# Patient Record
Sex: Male | Born: 1960 | Race: White | Hispanic: No | Marital: Married | Smoking: Never smoker
Health system: Southern US, Community
[De-identification: ages and names within clinical notes are randomized; demographics above are authoritative.]

## PROBLEM LIST (undated history)

## (undated) DIAGNOSIS — I639 Cerebral infarction, unspecified: Secondary | ICD-10-CM

## (undated) DIAGNOSIS — E119 Type 2 diabetes mellitus without complications: Secondary | ICD-10-CM

## (undated) DIAGNOSIS — E785 Hyperlipidemia, unspecified: Secondary | ICD-10-CM

## (undated) DIAGNOSIS — I10 Essential (primary) hypertension: Secondary | ICD-10-CM

## (undated) DIAGNOSIS — R748 Abnormal levels of other serum enzymes: Secondary | ICD-10-CM

## (undated) DIAGNOSIS — K7290 Hepatic failure, unspecified without coma: Secondary | ICD-10-CM

## (undated) DIAGNOSIS — E669 Obesity, unspecified: Secondary | ICD-10-CM

## (undated) DIAGNOSIS — L0390 Cellulitis, unspecified: Secondary | ICD-10-CM

## (undated) HISTORY — PX: HEMORROIDECTOMY: SUR656

## (undated) HISTORY — DX: Essential (primary) hypertension: I10

## (undated) HISTORY — DX: Cerebral infarction, unspecified: I63.9

## (undated) HISTORY — DX: Hyperlipidemia, unspecified: E78.5

---

## 2000-06-12 ENCOUNTER — Emergency Department (HOSPITAL_COMMUNITY): Admission: EM | Admit: 2000-06-12 | Discharge: 2000-06-12 | Payer: Self-pay | Admitting: *Deleted

## 2000-09-19 ENCOUNTER — Encounter: Payer: Self-pay | Admitting: Family Medicine

## 2000-09-19 ENCOUNTER — Ambulatory Visit (HOSPITAL_COMMUNITY): Admission: RE | Admit: 2000-09-19 | Discharge: 2000-09-19 | Payer: Self-pay | Admitting: Family Medicine

## 2006-07-11 ENCOUNTER — Ambulatory Visit (HOSPITAL_COMMUNITY): Admission: RE | Admit: 2006-07-11 | Discharge: 2006-07-11 | Payer: Self-pay | Admitting: Family Medicine

## 2006-12-10 ENCOUNTER — Ambulatory Visit (HOSPITAL_COMMUNITY): Admission: RE | Admit: 2006-12-10 | Discharge: 2006-12-10 | Payer: Self-pay | Admitting: Orthopaedic Surgery

## 2007-05-27 ENCOUNTER — Ambulatory Visit (HOSPITAL_COMMUNITY): Admission: RE | Admit: 2007-05-27 | Discharge: 2007-05-27 | Payer: Self-pay | Admitting: Internal Medicine

## 2008-03-15 ENCOUNTER — Ambulatory Visit (HOSPITAL_COMMUNITY): Admission: RE | Admit: 2008-03-15 | Discharge: 2008-03-15 | Payer: Self-pay | Admitting: Family Medicine

## 2008-03-29 ENCOUNTER — Ambulatory Visit (HOSPITAL_COMMUNITY): Admission: RE | Admit: 2008-03-29 | Discharge: 2008-03-29 | Payer: Self-pay | Admitting: Family Medicine

## 2009-10-24 ENCOUNTER — Encounter (HOSPITAL_COMMUNITY)
Admission: RE | Admit: 2009-10-24 | Discharge: 2009-11-23 | Payer: Self-pay | Source: Home / Self Care | Admitting: Orthopaedic Surgery

## 2009-11-14 ENCOUNTER — Ambulatory Visit (HOSPITAL_COMMUNITY)
Admission: RE | Admit: 2009-11-14 | Discharge: 2009-11-14 | Payer: Self-pay | Source: Home / Self Care | Admitting: Orthopaedic Surgery

## 2009-11-30 ENCOUNTER — Encounter (HOSPITAL_COMMUNITY)
Admission: RE | Admit: 2009-11-30 | Discharge: 2009-12-30 | Payer: Self-pay | Source: Home / Self Care | Attending: Orthopaedic Surgery | Admitting: Orthopaedic Surgery

## 2011-03-25 ENCOUNTER — Other Ambulatory Visit (HOSPITAL_COMMUNITY): Payer: Self-pay | Admitting: Family Medicine

## 2011-03-27 ENCOUNTER — Ambulatory Visit (HOSPITAL_COMMUNITY)
Admission: RE | Admit: 2011-03-27 | Discharge: 2011-03-27 | Disposition: A | Discharge status: Home / Self Care | Payer: BC Managed Care – PPO | Source: Ambulatory Visit | Attending: Family Medicine | Admitting: Family Medicine

## 2011-03-27 DIAGNOSIS — R7401 Elevation of levels of liver transaminase levels: Secondary | ICD-10-CM | POA: Insufficient documentation

## 2011-03-27 DIAGNOSIS — R7402 Elevation of levels of lactic acid dehydrogenase (LDH): Secondary | ICD-10-CM | POA: Insufficient documentation

## 2011-03-27 DIAGNOSIS — K7689 Other specified diseases of liver: Secondary | ICD-10-CM | POA: Insufficient documentation

## 2011-03-27 DIAGNOSIS — R161 Splenomegaly, not elsewhere classified: Secondary | ICD-10-CM | POA: Insufficient documentation

## 2013-01-04 ENCOUNTER — Other Ambulatory Visit (HOSPITAL_COMMUNITY): Payer: Self-pay | Admitting: Family Medicine

## 2013-01-04 DIAGNOSIS — R9389 Abnormal findings on diagnostic imaging of other specified body structures: Secondary | ICD-10-CM

## 2013-01-11 ENCOUNTER — Ambulatory Visit (HOSPITAL_COMMUNITY)
Admission: RE | Admit: 2013-01-11 | Discharge: 2013-01-11 | Disposition: A | Discharge status: Home / Self Care | Payer: BC Managed Care – PPO | Source: Ambulatory Visit | Attending: Family Medicine | Admitting: Family Medicine

## 2013-01-11 ENCOUNTER — Encounter (HOSPITAL_COMMUNITY): Payer: Self-pay

## 2013-01-11 DIAGNOSIS — I8500 Esophageal varices without bleeding: Secondary | ICD-10-CM | POA: Insufficient documentation

## 2013-01-11 DIAGNOSIS — K7689 Other specified diseases of liver: Secondary | ICD-10-CM | POA: Insufficient documentation

## 2013-01-11 DIAGNOSIS — I2584 Coronary atherosclerosis due to calcified coronary lesion: Secondary | ICD-10-CM | POA: Insufficient documentation

## 2013-01-11 DIAGNOSIS — R911 Solitary pulmonary nodule: Secondary | ICD-10-CM | POA: Insufficient documentation

## 2013-01-11 DIAGNOSIS — R9389 Abnormal findings on diagnostic imaging of other specified body structures: Secondary | ICD-10-CM

## 2013-01-11 DIAGNOSIS — K7460 Unspecified cirrhosis of liver: Secondary | ICD-10-CM | POA: Insufficient documentation

## 2013-01-11 MED ORDER — IOHEXOL 300 MG/ML  SOLN
80.0000 mL | Freq: Once | INTRAMUSCULAR | Status: AC | PRN
  Administered 2013-01-11: 80 mL via INTRAVENOUS

## 2013-05-10 ENCOUNTER — Emergency Department (HOSPITAL_COMMUNITY): Payer: BC Managed Care – PPO

## 2013-05-10 ENCOUNTER — Emergency Department (HOSPITAL_COMMUNITY): Payer: Self-pay

## 2013-05-10 ENCOUNTER — Inpatient Hospital Stay (HOSPITAL_COMMUNITY): Payer: Self-pay

## 2013-05-10 ENCOUNTER — Inpatient Hospital Stay (HOSPITAL_COMMUNITY)
Admission: EM | Admit: 2013-05-10 | Discharge: 2013-05-11 | DRG: 392 | Disposition: A | Discharge status: Home / Self Care | Payer: BC Managed Care – PPO | Attending: Internal Medicine | Admitting: Internal Medicine

## 2013-05-10 ENCOUNTER — Encounter (HOSPITAL_COMMUNITY): Payer: Self-pay | Admitting: Emergency Medicine

## 2013-05-10 DIAGNOSIS — E669 Obesity, unspecified: Secondary | ICD-10-CM

## 2013-05-10 DIAGNOSIS — D6959 Other secondary thrombocytopenia: Secondary | ICD-10-CM | POA: Diagnosis present

## 2013-05-10 DIAGNOSIS — R1011 Right upper quadrant pain: Principal | ICD-10-CM | POA: Diagnosis present

## 2013-05-10 DIAGNOSIS — E1165 Type 2 diabetes mellitus with hyperglycemia: Secondary | ICD-10-CM

## 2013-05-10 DIAGNOSIS — Z6832 Body mass index (BMI) 32.0-32.9, adult: Secondary | ICD-10-CM

## 2013-05-10 DIAGNOSIS — F1010 Alcohol abuse, uncomplicated: Secondary | ICD-10-CM | POA: Diagnosis present

## 2013-05-10 DIAGNOSIS — E119 Type 2 diabetes mellitus without complications: Secondary | ICD-10-CM | POA: Diagnosis present

## 2013-05-10 DIAGNOSIS — IMO0001 Reserved for inherently not codable concepts without codable children: Secondary | ICD-10-CM | POA: Diagnosis present

## 2013-05-10 DIAGNOSIS — R079 Chest pain, unspecified: Secondary | ICD-10-CM | POA: Diagnosis present

## 2013-05-10 HISTORY — DX: Abnormal levels of other serum enzymes: R74.8

## 2013-05-10 HISTORY — DX: Type 2 diabetes mellitus without complications: E11.9

## 2013-05-10 HISTORY — DX: Obesity, unspecified: E66.9

## 2013-05-10 LAB — CBC WITH DIFFERENTIAL/PLATELET
Basophils Absolute: 0 10*3/uL (ref 0.0–0.1)
Basophils Relative: 1 % (ref 0–1)
EOS PCT: 3 % (ref 0–5)
Eosinophils Absolute: 0.2 10*3/uL (ref 0.0–0.7)
HEMATOCRIT: 41.7 % (ref 39.0–52.0)
Hemoglobin: 14.9 g/dL (ref 13.0–17.0)
LYMPHS ABS: 1 10*3/uL (ref 0.7–4.0)
LYMPHS PCT: 24 % (ref 12–46)
MCH: 34.1 pg — ABNORMAL HIGH (ref 26.0–34.0)
MCHC: 35.7 g/dL (ref 30.0–36.0)
MCV: 95.4 fL (ref 78.0–100.0)
Monocytes Absolute: 0.5 10*3/uL (ref 0.1–1.0)
Monocytes Relative: 12 % (ref 3–12)
NEUTROS ABS: 2.6 10*3/uL (ref 1.7–7.7)
Neutrophils Relative %: 60 % (ref 43–77)
PLATELETS: 90 10*3/uL — AB (ref 150–400)
RBC: 4.37 MIL/uL (ref 4.22–5.81)
RDW: 12.5 % (ref 11.5–15.5)
WBC: 4.4 10*3/uL (ref 4.0–10.5)

## 2013-05-10 LAB — COMPREHENSIVE METABOLIC PANEL
ALK PHOS: 223 U/L — AB (ref 39–117)
ALT: 34 U/L (ref 0–53)
AST: 40 U/L — AB (ref 0–37)
Albumin: 3.2 g/dL — ABNORMAL LOW (ref 3.5–5.2)
BILIRUBIN TOTAL: 2.1 mg/dL — AB (ref 0.3–1.2)
BUN: 9 mg/dL (ref 6–23)
CHLORIDE: 100 meq/L (ref 96–112)
CO2: 23 meq/L (ref 19–32)
CREATININE: 0.58 mg/dL (ref 0.50–1.35)
Calcium: 9.3 mg/dL (ref 8.4–10.5)
GFR calc Af Amer: >90 mL/min (ref >90)
Glucose, Bld: 313 mg/dL — ABNORMAL HIGH (ref 70–99)
POTASSIUM: 4 meq/L (ref 3.7–5.3)
Sodium: 137 mEq/L (ref 137–147)
Total Protein: 7.8 g/dL (ref 6.0–8.3)

## 2013-05-10 LAB — TROPONIN I
Troponin I: <0.3 ng/mL (ref <0.30)
Troponin I: <0.3 ng/mL (ref <0.30)

## 2013-05-10 LAB — LIPASE, BLOOD: Lipase: 34 U/L (ref 11–59)

## 2013-05-10 LAB — GLUCOSE, CAPILLARY: GLUCOSE-CAPILLARY: 197 mg/dL — AB (ref 70–99)

## 2013-05-10 LAB — PRO B NATRIURETIC PEPTIDE: Pro B Natriuretic peptide (BNP): 15.5 pg/mL (ref 0–125)

## 2013-05-10 MED ORDER — SODIUM CHLORIDE 0.9 % IV SOLN
INTRAVENOUS | Status: DC
  Administered 2013-05-10: 18:00:00 via INTRAVENOUS

## 2013-05-10 MED ORDER — IOHEXOL 350 MG/ML SOLN
100.0000 mL | Freq: Once | INTRAVENOUS | Status: AC | PRN
  Administered 2013-05-10: 100 mL via INTRAVENOUS

## 2013-05-10 MED ORDER — GLYBURIDE-METFORMIN 2.5-500 MG PO TABS: 2.0000 | ORAL_TABLET | Freq: Two times a day (BID) | ORAL | Status: DC

## 2013-05-10 MED ORDER — SODIUM CHLORIDE 0.9 % IV BOLUS (SEPSIS)
250.0000 mL | Freq: Once | INTRAVENOUS | Status: AC
  Administered 2013-05-10: 250 mL via INTRAVENOUS

## 2013-05-10 MED ORDER — INSULIN ASPART 100 UNIT/ML ~~LOC~~ SOLN
0.0000 [IU] | Freq: Three times a day (TID) | SUBCUTANEOUS | Status: DC
  Administered 2013-05-10 – 2013-05-11 (×2): 4 [IU] via SUBCUTANEOUS
  Administered 2013-05-11: 7 [IU] via SUBCUTANEOUS

## 2013-05-10 MED ORDER — HEPARIN SODIUM (PORCINE) 5000 UNIT/ML IJ SOLN
5000.0000 [IU] | Freq: Three times a day (TID) | INTRAMUSCULAR | Status: DC
  Administered 2013-05-10 (×2): 5000 [IU] via SUBCUTANEOUS
  Filled 2013-05-10 (×4): qty 1

## 2013-05-10 MED ORDER — NITROGLYCERIN 0.4 MG SL SUBL: 0.4000 mg | SUBLINGUAL_TABLET | SUBLINGUAL | Status: DC | PRN

## 2013-05-10 MED ORDER — ONDANSETRON HCL 4 MG/2ML IJ SOLN: 4.0000 mg | Freq: Four times a day (QID) | INTRAMUSCULAR | Status: DC | PRN

## 2013-05-10 MED ORDER — METFORMIN HCL 500 MG PO TABS
500.0000 mg | ORAL_TABLET | Freq: Two times a day (BID) | ORAL | Status: DC
  Administered 2013-05-10 – 2013-05-11 (×2): 500 mg via ORAL
  Filled 2013-05-10 (×2): qty 1

## 2013-05-10 MED ORDER — ONDANSETRON HCL 4 MG PO TABS: 4.0000 mg | ORAL_TABLET | Freq: Four times a day (QID) | ORAL | Status: DC | PRN

## 2013-05-10 MED ORDER — PANTOPRAZOLE SODIUM 40 MG PO TBEC
40.0000 mg | DELAYED_RELEASE_TABLET | Freq: Every day | ORAL | Status: DC
Start: 2013-05-10 — End: 2013-05-10
  Administered 2013-05-10: 40 mg via ORAL

## 2013-05-10 MED ORDER — ASPIRIN 81 MG PO CHEW
324.0000 mg | CHEWABLE_TABLET | Freq: Once | ORAL | Status: AC
  Administered 2013-05-10: 324 mg via ORAL
  Filled 2013-05-10: qty 4

## 2013-05-10 MED ORDER — INSULIN ASPART 100 UNIT/ML ~~LOC~~ SOLN
0.0000 [IU] | Freq: Every day | SUBCUTANEOUS | Status: DC
  Administered 2013-05-10: 2 [IU] via SUBCUTANEOUS

## 2013-05-10 MED ORDER — GLYBURIDE 5 MG PO TABS
2.5000 mg | ORAL_TABLET | Freq: Two times a day (BID) | ORAL | Status: DC
  Administered 2013-05-10 – 2013-05-11 (×2): 2.5 mg via ORAL
  Filled 2013-05-10 (×2): qty 1

## 2013-05-10 MED ORDER — PANTOPRAZOLE SODIUM 40 MG PO TBEC
40.0000 mg | DELAYED_RELEASE_TABLET | Freq: Every day | ORAL | Status: DC
  Administered 2013-05-11: 40 mg via ORAL
  Filled 2013-05-10 (×2): qty 1

## 2013-05-10 NOTE — H&P (Signed)
[facility] History and Physical  NIL [name]:[id number] DOB: 10/22/1960 DOA: 05/10/2013  Referring physician: ER. PCP: Kirk Ruths, MD   Chief Complaint: Right upper quadrant abdominal pain. Left chest pain.  HPI: [name] is a 53 y.o. male  This is a 53 year old obese diabetic man who gives a two-month history of right upper quadrant pain which has been intermittent but more frequent in the last week or so. It has been associated with some nausea and retching. He also gives a two-week history of left chest pain which has been sharp and very short and intermittent but seem to be more prolonged today as he was going up some stairs. He was associated with dyspnea. He is a nonsmoker but does drink 10-12 beers a day. He is being now admitted for further investigation and management.   Review of Systems:  Constitutional:  No weight loss, night sweats, Fevers, chills, fatigue.  HEENT:  No headaches, Difficulty swallowing,Tooth/dental problems,Sore throat,  No sneezing, itching, ear ache, nasal congestion, post nasal drip,  Cardio-vascular:  No  Orthopnea, PND, swelling in lower extremities, anasarca, dizziness, palpitations  GI:  No heartburn, indigestion,  diarrhea, change in bowel habits, loss of appetite  Resp:   No excess mucus, no productive cough, No non-productive cough, No coughing up of blood.No change in color of mucus.No wheezing.No chest wall deformity  Skin:  no rash or lesions.  GU:  no dysuria, change in color of urine, no urgency or frequency. No flank pain.  Musculoskeletal:  No joint pain or swelling. No decreased range of motion. No back pain.  Psych:  No change in mood or affect. No depression or anxiety. No memory loss.   Past Medical History  Diagnosis Date  . Diabetes mellitus without complication   . Elevated liver enzymes   . Obesity 05/10/2013   Past Surgical History  Procedure Laterality Date  . Hemorroidectomy     Social  History:  reports that he has never smoked. He does not have any smokeless tobacco history on file. He reports that he drinks alcohol-he drinks 10-12 beers every day.Marland Kitchen He reports that he does not use illicit drugs.  Allergies  Allergen Reactions  . Morphine And Related     Nausea/sick on stomach    No family history on file.   Prior to Admission medications   Medication Sig Start Date End Date Taking? Authorizing Provider  esomeprazole (NEXIUM) 20 MG capsule Take 20 mg by mouth daily at 12 noon.   Yes Historical Provider, MD  glyBURIDE-metformin (GLUCOVANCE) 2.5-500 MG per tablet Take 2 tablets by mouth 2 (two) times daily with a meal.   Yes Historical Provider, MD   Physical Exam: Filed Vitals:   05/10/13 1445  BP: 143/71  Pulse: 89  Temp:   Resp: 18    BP 143/71  Pulse 89  Temp(Src) 97.6 F (36.4 C) (Oral)  Resp 18  Ht 6\' 2"  (1.88 m)  Wt 113.399 kg (250 lb)  BMI 32.08 kg/m2  SpO2 99%  General:  Appears calm and comfortable. Obese. Not in acute pain. Possibly jaundiced. No clubbing. Eyes: PERRL, normal lids, irises & conjunctiva ENT: grossly normal hearing, lips & tongue Neck: no LAD, masses or thyromegaly Cardiovascular: RRR, no m/r/g. No LE edema. Telemetry: SR, no arrhythmias  Respiratory: CTA bilaterally, no w/r/r. Normal respiratory effort. Abdomen: soft, hepatomegaly with positive Murphy's sign. No clear splenomegaly. No spider nevi. No peripheral edema. Skin: no rash or induration seen on limited exam  Musculoskeletal: grossly normal tone BUE/BLE. Appears to have left anterior chest wall tenderness, reproducing the pain. Psychiatric: grossly normal mood and affect, speech fluent and appropriate Neurologic: grossly non-focal.          Labs on Admission:  Basic Metabolic Panel:  Recent Labs Lab 05/10/13 1107  NA 137  K 4.0  CL 100  CO2 23  GLUCOSE 313*  BUN 9  CREATININE 0.58  CALCIUM 9.3   Liver Function Tests:  Recent Labs Lab 05/10/13 1107    AST 40*  ALT 34  ALKPHOS 223*  BILITOT 2.1*  PROT 7.8  ALBUMIN 3.2*     CBC:  Recent Labs Lab 05/10/13 1107  WBC 4.4  NEUTROABS 2.6  HGB 14.9  HCT 41.7  MCV 95.4  PLT 90*   Cardiac Enzymes:  Recent Labs Lab 05/10/13 1107 05/10/13 1328  TROPONINI <0.30 <0.30    BNP (last 3 results)  Recent Labs  05/10/13 1328  PROBNP 15.5      Radiological Exams on Admission: Dg Chest 2 View  05/10/2013   CLINICAL DATA:  Chest pain  EXAM: CHEST  2 VIEW  COMPARISON:  03/15/2008  FINDINGS: The heart size and mediastinal contours are within normal limits. Both lungs are clear. The visualized skeletal structures are unremarkable. Stable mild hyperinflation. Trachea is midline.  IMPRESSION: No active cardiopulmonary disease.   Electronically Signed   By: Ruel Favorsrevor  Shick M.D.   On: 05/10/2013 11:43   Ct Angio Chest Pe W/cm &/or Wo Cm  05/10/2013   CLINICAL DATA:  Constant chest pain since yesterday morning at 0800, worse after climbing stairs this morning, associated nausea and vomiting, shortness of breath, pain radiating to back  EXAM: CT ANGIOGRAPHY CHEST WITH CONTRAST  TECHNIQUE: Multidetector CT imaging of the chest was performed using the standard protocol during bolus administration of intravenous contrast. Multiplanar CT image reconstructions and MIPs were obtained to evaluate the vascular anatomy.  CONTRAST:  100mL OMNIPAQUE IOHEXOL 350 MG/ML SOLN IV  COMPARISON:  01/11/2013  FINDINGS: Scattered atherosclerotic calcifications of the aorta and coronary arteries.  No aortic aneurysm or dissection.  Spleen incompletely visualized but question splenic enlargement is raised.  Visualized upper abdomen otherwise unremarkable.  No thoracic adenopathy.  Pulmonary arteries patent.  No evidence of pulmonary embolism.  Mild gynecomastia bilaterally.  Lungs clear.  No pleural effusion or pneumothorax.  No acute osseous findings.  Review of the MIP images confirms the above findings.  IMPRESSION: No  evidence of pulmonary embolism.  Scattered atherosclerotic calcifications including coronary arteries.  Question splenic enlargement, spleen incompletely visualized.   Electronically Signed   By: Ulyses SouthwardMark  Boles M.D.   On: 05/10/2013 14:48    EKG: Independently reviewed. Normal sinus rhythm without any acute ST-T wave changes. Nonspecific T wave changes which I think are chronic.  Assessment/Plan   1. Right upper quadrant abdominal pain concerning for biliary disease/cholelithiasis. He does not appear to have acute cholecystitis. 2. Elevated liver enzymes secondary to possible biliary disease. 3. Left-sided chest pain, appears to be musculoskeletal. 4. Type 2 diabetes mellitus, uncontrolled. 5. Alcohol abuse. 6. Obesity. 7. Thrombocytopenia likely secondary to alcoholism.  Plan: 1. Admit to medical floor and telemetry. 2. Ultrasound of the abdomen. 3. Serial cardiac enzymes. 4. Sliding scale of insulin to control diabetes.  Further recommendations will depend on patient's hospital progress.  Code Status: Full code.  Family Communication: I discussed the plan with patient at the bedside.   Disposition Plan: Home when medically stable.  Time  spent: 45 minutes.  Wilson SingerNimish C Gosrani [facility] Pager [phone].

## 2013-05-10 NOTE — ED Notes (Signed)
Pt c/o chest pain that started yesterday.  Reports pain is in left chest and is worse with exertion.  Describes as pressure and sharp.  C/O SOB today.  Also says feels like liver is swollen.

## 2013-05-10 NOTE — ED Provider Notes (Signed)
CSN: [id number]     Arrival date & time 05/10/13  1039 History  This chart was scribed for Wilson Singer, MD by Tana Conch, ED Scribe. This patient was seen in room APA17/AP[facility]17 and the patient's care was started at 1:07 PM.    Chief Complaint  Patient presents with  . Chest Pain     Patient is a 53 y.o. male presenting with chest pain. The history is provided by the patient. No language interpreter was used.  Chest Pain Pain location:  Substernal area and L chest Pain quality: stabbing   Pain radiates to:  Upper back Pain radiates to the back: yes   Pain severity:  Moderate Onset quality:  Gradual Duration:  2 days Timing:  Sporadic Progression:  Waxing and waning Chronicity:  Recurrent Context: breathing, lifting and stress   Relieved by:  Nothing Worsened by:  Nothing tried Ineffective treatments:  None tried Associated symptoms: abdominal pain (RUQ), back pain (chronic), headache, nausea, shortness of breath and vomiting   Associated symptoms: no cough and no fever     HPI Comments: [name] is a 53 y.o. male who presents to the Emergency Department complaining of constant, waxing and waning CP that began yesterday morning around 8am. The pain is currently a 6/10, the pain was at it worst at around midnight when it was a 9/10. The pain was aggravated this morning when he had to climb a set of stairs and the pain worsened to a 9/10. Pt reports that he had nausea and vomitting after climbing the stairs this morning. He also reports SOB and pain radiating to his back. He denies diaphoresis.  Positive family history for heart problems, his father.   Past Medical History  Diagnosis Date  . Diabetes mellitus without complication   . Elevated liver enzymes   . Obesity 05/10/2013   Past Surgical History  Procedure Laterality Date  . Hemorroidectomy     No family history on file. History  Substance Use Topics  . Smoking status: Never Smoker   . Smokeless  tobacco: Not on file  . Alcohol Use: Yes     Comment: daily    Review of Systems  Constitutional: Positive for chills. Negative for fever.  HENT: Negative for congestion, postnasal drip, rhinorrhea, sinus pressure and sneezing.   Respiratory: Positive for shortness of breath. Negative for cough.   Cardiovascular: Positive for chest pain.  Gastrointestinal: Positive for nausea, vomiting and abdominal pain (RUQ). Negative for diarrhea.  Musculoskeletal: Positive for back pain (chronic) and neck pain (chronic).  Skin: Negative for rash.  Neurological: Positive for headaches.  Hematological: Does not bruise/bleed easily.      Allergies  Morphine and related  Home Medications   Prior to Admission medications   Medication Sig Start Date End Date Taking? Authorizing Provider  esomeprazole (NEXIUM) 20 MG capsule Take 20 mg by mouth daily at 12 noon.   Yes Historical Provider, MD  glyBURIDE-metformin (GLUCOVANCE) 2.5-500 MG per tablet Take 2 tablets by mouth 2 (two) times daily with a meal.   Yes Historical Provider, MD   BP 151/68  Pulse 94  Temp(Src) 97.6 F (36.4 C) (Oral)  Resp 16  Ht 6\' 2"  (1.88 m)  Wt 250 lb (113.399 kg)  BMI 32.08 kg/m2  SpO2 97% Physical Exam  Nursing note and vitals reviewed. Constitutional: He is oriented to person, place, and time. He appears well-developed and well-nourished.  HENT:  Head: Normocephalic and atraumatic.  Neck: Normal range of  motion.  Cardiovascular: Normal rate, regular rhythm and normal heart sounds.   Pulmonary/Chest: Effort normal and breath sounds normal.  Abdominal: Soft. Bowel sounds are normal. There is tenderness (slightly tender RUQ).  Neurological: He is alert and oriented to person, place, and time. No cranial nerve deficit. Coordination normal.    ED Course  Procedures (including critical care time)  DIAGNOSTIC STUDIES: Oxygen Saturation is 97% on RA, normal by my interpretation.    COORDINATION OF  CARE:   1:12 PM-Discussed treatment plan which includes  (CXR, CBC panel, CMP, UA) with pt at bedside and pt agreed to plan.   Labs Review Results for orders placed during the hospital encounter of 05/10/13  CBC WITH DIFFERENTIAL      Result Value Ref Range   WBC 4.4  4.0 - 10.5 K/uL   RBC 4.37  4.22 - 5.81 MIL/uL   Hemoglobin 14.9  13.0 - 17.0 g/dL   HCT 16.141.7  09.639.0 - 04.552.0 %   MCV 95.4  78.0 - 100.0 fL   MCH 34.1 (*) 26.0 - 34.0 pg   MCHC 35.7  30.0 - 36.0 g/dL   RDW 40.912.5  81.111.5 - 91.415.5 %   Platelets 90 (*) 150 - 400 K/uL   Neutrophils Relative % 60  43 - 77 %   Neutro Abs 2.6  1.7 - 7.7 K/uL   Lymphocytes Relative 24  12 - 46 %   Lymphs Abs 1.0  0.7 - 4.0 K/uL   Monocytes Relative 12  3 - 12 %   Monocytes Absolute 0.5  0.1 - 1.0 K/uL   Eosinophils Relative 3  0 - 5 %   Eosinophils Absolute 0.2  0.0 - 0.7 K/uL   Basophils Relative 1  0 - 1 %   Basophils Absolute 0.0  0.0 - 0.1 K/uL   WBC Morphology ATYPICAL LYMPHOCYTES    COMPREHENSIVE METABOLIC PANEL      Result Value Ref Range   Sodium 137  137 - 147 mEq/L   Potassium 4.0  3.7 - 5.3 mEq/L   Chloride 100  96 - 112 mEq/L   CO2 23  19 - 32 mEq/L   Glucose, Bld 313 (*) 70 - 99 mg/dL   BUN 9  6 - 23 mg/dL   Creatinine, Ser 7.820.58  0.50 - 1.35 mg/dL   Calcium 9.3  8.4 - 95.610.5 mg/dL   Total Protein 7.8  6.0 - 8.3 g/dL   Albumin 3.2 (*) 3.5 - 5.2 g/dL   AST 40 (*) 0 - 37 U/L   ALT 34  0 - 53 U/L   Alkaline Phosphatase 223 (*) 39 - 117 U/L   Total Bilirubin 2.1 (*) 0.3 - 1.2 mg/dL   GFR calc non Af Amer >90  >90 mL/min   GFR calc Af Amer >90  >90 mL/min  TROPONIN I      Result Value Ref Range   Troponin I <0.30  <0.30 ng/mL  TROPONIN I      Result Value Ref Range   Troponin I <0.30  <0.30 ng/mL  PRO B NATRIURETIC PEPTIDE      Result Value Ref Range   Pro B Natriuretic peptide (BNP) 15.5  0 - 125 pg/mL   Dg Chest 2 View  05/10/2013   CLINICAL DATA:  Chest pain  EXAM: CHEST  2 VIEW  COMPARISON:  03/15/2008  FINDINGS: The  heart size and mediastinal contours are within normal limits. Both lungs are clear. The visualized skeletal structures  are unremarkable. Stable mild hyperinflation. Trachea is midline.  IMPRESSION: No active cardiopulmonary disease.   Electronically Signed   By: Ruel Favorsrevor  Shick M.D.   On: 05/10/2013 11:43     Medications  nitroGLYCERIN (NITROSTAT) SL tablet 0.4 mg (not administered)  0.9 %  sodium chloride infusion (not administered)  aspirin chewable tablet 324 mg (324 mg Oral Given 05/10/13 1350)  sodium chloride 0.9 % bolus 250 mL (0 mLs Intravenous Stopped 05/10/13 1547)  iohexol (OMNIPAQUE) 350 MG/ML injection 100 mL (100 mLs Intravenous Contrast Given 05/10/13 1429)     Imaging Review  Dg Chest 2 View  05/10/2013   CLINICAL DATA:  Chest pain  EXAM: CHEST  2 VIEW  COMPARISON:  03/15/2008  FINDINGS: The heart size and mediastinal contours are within normal limits. Both lungs are clear. The visualized skeletal structures are unremarkable. Stable mild hyperinflation. Trachea is midline.  IMPRESSION: No active cardiopulmonary disease.   Electronically Signed   By: Ruel Favorsrevor  Shick M.D.   On: 05/10/2013 11:43   Ct Angio Chest Pe W/cm &/or Wo Cm  05/10/2013   CLINICAL DATA:  Constant chest pain since yesterday morning at 0800, worse after climbing stairs this morning, associated nausea and vomiting, shortness of breath, pain radiating to back  EXAM: CT ANGIOGRAPHY CHEST WITH CONTRAST  TECHNIQUE: Multidetector CT imaging of the chest was performed using the standard protocol during bolus administration of intravenous contrast. Multiplanar CT image reconstructions and MIPs were obtained to evaluate the vascular anatomy.  CONTRAST:  100mL OMNIPAQUE IOHEXOL 350 MG/ML SOLN IV  COMPARISON:  01/11/2013  FINDINGS: Scattered atherosclerotic calcifications of the aorta and coronary arteries.  No aortic aneurysm or dissection.  Spleen incompletely visualized but question splenic enlargement is raised.  Visualized  upper abdomen otherwise unremarkable.  No thoracic adenopathy.  Pulmonary arteries patent.  No evidence of pulmonary embolism.  Mild gynecomastia bilaterally.  Lungs clear.  No pleural effusion or pneumothorax.  No acute osseous findings.  Review of the MIP images confirms the above findings.  IMPRESSION: No evidence of pulmonary embolism.  Scattered atherosclerotic calcifications including coronary arteries.  Question splenic enlargement, spleen incompletely visualized.   Electronically Signed   By: Ulyses SouthwardMark  Boles M.D.   On: 05/10/2013 14:48     EKG Interpretation   Date/Time:  Monday May 10 2013 11:02:03 EDT Ventricular Rate:  95 PR Interval:  162 QRS Duration: 86 QT Interval:  382 QTC Calculation: 480 R Axis:   43 Text Interpretation:  Normal sinus rhythm Nonspecific T wave abnormality  Prolonged QT Abnormal ECG No previous ECGs available Confirmed by  Stanislawa Gaffin  MD, [facility] Bissonette [phone]([id number]) on 05/10/2013 12:52:19 PM      MDM   Final diagnoses:  Chest pain   I personally performed the services described in this documentation, which was scribed in my presence. The recorded information has been reviewed and is accurate.    Patient with significant cardiac risk factors include diabetes and a strong family history of premature cardiac disease in his father. Patient with persistent left-sided chest pain. Pain did get worse with exertion today particularly when going up steps also associated with shortness of breath ET NG or negative for any acute findings include pulmonary embolus. Chest x-ray negative for pneumonia or congestive heart failure. Patient's EKG without acute changes troponin x2 of been negative. However since worse pain this morning at work recommended a cardiac rule out. Patient will be seen by hospitalist.  Patient also with complaint of some right upper quadrant abdominal pain  liver function tests show an elevation in bilirubin ultrasound of the abdomen is or been ordered to evaluate  for the possibility of gallbladder disease. Patient without significant tenderness in the right upper quadrant clinically not consistent with acute cholecystitis. In edition no significant leukocytosis.   Shelda Jakes, MD 05/10/13 [phone]

## 2013-05-11 DIAGNOSIS — R079 Chest pain, unspecified: Secondary | ICD-10-CM

## 2013-05-11 DIAGNOSIS — F1010 Alcohol abuse, uncomplicated: Secondary | ICD-10-CM

## 2013-05-11 DIAGNOSIS — R1011 Right upper quadrant pain: Principal | ICD-10-CM

## 2013-05-11 DIAGNOSIS — E119 Type 2 diabetes mellitus without complications: Secondary | ICD-10-CM

## 2013-05-11 LAB — COMPREHENSIVE METABOLIC PANEL
ALT: 29 U/L (ref 0–53)
AST: 34 U/L (ref 0–37)
Albumin: 2.9 g/dL — ABNORMAL LOW (ref 3.5–5.2)
Alkaline Phosphatase: 209 U/L — ABNORMAL HIGH (ref 39–117)
BUN: 9 mg/dL (ref 6–23)
CALCIUM: 9 mg/dL (ref 8.4–10.5)
CO2: 24 mEq/L (ref 19–32)
CREATININE: 0.56 mg/dL (ref 0.50–1.35)
Chloride: 101 mEq/L (ref 96–112)
GFR calc non Af Amer: >90 mL/min (ref >90)
GLUCOSE: 221 mg/dL — AB (ref 70–99)
Potassium: 3.7 mEq/L (ref 3.7–5.3)
Sodium: 137 mEq/L (ref 137–147)
Total Bilirubin: 1.5 mg/dL — ABNORMAL HIGH (ref 0.3–1.2)
Total Protein: 7.1 g/dL (ref 6.0–8.3)

## 2013-05-11 LAB — CBC
HCT: 39.6 % (ref 39.0–52.0)
HEMOGLOBIN: 14.4 g/dL (ref 13.0–17.0)
MCH: 34.4 pg — ABNORMAL HIGH (ref 26.0–34.0)
MCHC: 36.4 g/dL — ABNORMAL HIGH (ref 30.0–36.0)
MCV: 94.5 fL (ref 78.0–100.0)
Platelets: 79 10*3/uL — ABNORMAL LOW (ref 150–400)
RBC: 4.19 MIL/uL — AB (ref 4.22–5.81)
RDW: 12.4 % (ref 11.5–15.5)
WBC: 3.7 10*3/uL — ABNORMAL LOW (ref 4.0–10.5)

## 2013-05-11 LAB — GLUCOSE, CAPILLARY
GLUCOSE-CAPILLARY: 185 mg/dL — AB (ref 70–99)
Glucose-Capillary: 202 mg/dL — ABNORMAL HIGH (ref 70–99)
Glucose-Capillary: 215 mg/dL — ABNORMAL HIGH (ref 70–99)

## 2013-05-11 LAB — HEMOGLOBIN A1C
Hgb A1c MFr Bld: 7.2 % — ABNORMAL HIGH (ref <5.7)
MEAN PLASMA GLUCOSE: 160 mg/dL — AB (ref <117)

## 2013-05-11 LAB — TSH: TSH: 1.01 u[IU]/mL (ref 0.350–4.500)

## 2013-05-11 LAB — PROTIME-INR
INR: 1.14 (ref 0.00–1.49)
Prothrombin Time: 14.4 seconds (ref 11.6–15.2)

## 2013-05-11 LAB — TROPONIN I: Troponin I: <0.3 ng/mL (ref <0.30)

## 2013-05-11 MED ORDER — IBUPROFEN 800 MG PO TABS
400.0000 mg | ORAL_TABLET | Freq: Four times a day (QID) | ORAL | Status: DC | PRN
  Administered 2013-05-11: 400 mg via ORAL
  Filled 2013-05-11: qty 1

## 2013-05-11 NOTE — Discharge Summary (Signed)
Physician Discharge Summary  Jesus BenderDavid W [name] ZOX:[id number] DOB: May 07, 1960 DOA: 05/10/2013  PCP: Kirk RuthsMCGOUGH,Jesus M, MD  Admit date: 05/10/2013 Discharge date: 05/11/2013  Time spent: 45 minutes  Recommendations for Outpatient Follow-up:  -Will be discharged home today. -Advised to follow up with PCP in 2 weeks.   Discharge Diagnoses:  Active Problems:   Abdominal pain, RUQ (right upper quadrant)   Chest pain   Diabetes   Obesity   Alcohol abuse   RUQ abdominal pain   Discharge Condition: Stable and improved  Filed Weights   05/10/13 1113 05/10/13 1834  Weight: 113.399 kg (250 lb) 113.399 kg (250 lb)    History of present illness:  This is a 53 year old obese diabetic man who gives a two-month history of right upper quadrant pain which has been intermittent but more frequent in the last week or so. It has been associated with some nausea and retching. He also gives a two-week history of left chest pain which has been sharp and very short and intermittent but seem to be more prolonged today as he was going up some stairs. He was associated with dyspnea. He is a nonsmoker but does drink 10-12 beers a day. He is being now admitted for further investigation and management.   Hospital Course:   Chest Pain -Very atypical for cardiac source. -Only CAD risk factor is DM. -Has ruled out with negative troponins and a non-acute EKG. -no further work up deemed necessary at this time.  RUQ Abdominal Pain -LFTs WNL. -US without acute pathology (does have some hepatic steatosis). -Advised on weight loss, diabetes control and ETOH cessation.  ETOH Abuse -MVI. -Counseled on cessation.  Procedures:  None   Consultations:  None  Discharge Instructions  Discharge Orders   Future Orders Complete By Expires   Discontinue IV  As directed    Increase activity slowly  As directed        Medication List         esomeprazole 20 MG capsule  Commonly known as:  NEXIUM    Take 20 mg by mouth daily at 12 noon.     glyBURIDE-metformin 2.5-500 MG per tablet  Commonly known as:  GLUCOVANCE  Take 2 tablets by mouth 2 (two) times daily with a meal.       Allergies  Allergen Reactions  . Morphine And Related     Nausea/sick on stomach       Follow-up Information   Follow up with Kirk RuthsMCGOUGH,Jesus M, MD. Schedule an appointment as soon as possible for a visit in 2 weeks.   Specialty:  Family Medicine   Contact information:   [id number] [place] [address] [phone]        The results of significant diagnostics from this hospitalization (including imaging, microbiology, ancillary and laboratory) are listed below for reference.    Significant Diagnostic Studies: Dg Chest 2 View  05/10/2013   CLINICAL DATA:  Chest pain  EXAM: CHEST  2 VIEW  COMPARISON:  03/15/2008  FINDINGS: The heart size and mediastinal contours are within normal limits. Both lungs are clear. The visualized skeletal structures are unremarkable. Stable mild hyperinflation. Trachea is midline.  IMPRESSION: No active cardiopulmonary disease.   Electronically Signed   By: Jesus Favorsrevor  [name] M.D.   On: 05/10/2013 11:43   Ct Angio Chest Pe W/cm &/or Wo Cm  05/10/2013   CLINICAL DATA:  Constant chest pain since yesterday morning at 0800, worse after climbing stairs  this morning, associated nausea and vomiting, shortness of breath, pain radiating to back  EXAM: CT ANGIOGRAPHY CHEST WITH CONTRAST  TECHNIQUE: Multidetector CT imaging of the chest was performed using the standard protocol during bolus administration of intravenous contrast. Multiplanar CT image reconstructions and MIPs were obtained to evaluate the vascular anatomy.  CONTRAST:  100mL OMNIPAQUE IOHEXOL 350 MG/ML SOLN IV  COMPARISON:  01/11/2013  FINDINGS: Scattered atherosclerotic calcifications of the aorta and coronary arteries.  No aortic aneurysm or dissection.  Spleen incompletely visualized but question  splenic enlargement is raised.  Visualized upper abdomen otherwise unremarkable.  No thoracic adenopathy.  Pulmonary arteries patent.  No evidence of pulmonary embolism.  Mild gynecomastia bilaterally.  Lungs clear.  No pleural effusion or pneumothorax.  No acute osseous findings.  Review of the MIP images confirms the above findings.  IMPRESSION: No evidence of pulmonary embolism.  Scattered atherosclerotic calcifications including coronary arteries.  Question splenic enlargement, spleen incompletely visualized.   Electronically Signed   By: Ulyses SouthwardMark  [name] M.D.   On: 05/10/2013 14:48   [place] Abdomen Complete  05/10/2013   CLINICAL DATA:  Chest pain.  EXAM: ULTRASOUND ABDOMEN COMPLETE  COMPARISON:  US ABDOMEN COMPLETE dated 03/27/2011; US ABDOMEN COMPLETE dated 03/29/2008; CT ABDOMEN W/CM dated 05/27/2007; CT PELVIS W/CM dated 05/27/2007  FINDINGS: Gallbladder:  No gallstones or wall thickening visualized. No sonographic Murphy sign noted.  Common bile duct:  Diameter: Normal caliber of 3 mm.  Liver:  The liver demonstrates stable coarse echotexture and increased echogenicity, likely reflecting diffuse steatosis. No overt cirrhotic contour abnormalities or focal lesions are identified. There is no evidence of intrahepatic biliary ductal dilatation. The portal vein is open.  IVC:  No abnormality visualized.  Pancreas:  Visualized portion unremarkable.  Spleen:  The spleen is enlarged with estimated splenic volume of 1220 mL.  Right Kidney:  Length: 13.1 cm. Echogenicity within normal limits. No mass or hydronephrosis visualized.  Left Kidney:  Length: 14 cm. Echogenicity within normal limits. No mass or hydronephrosis visualized.  Abdominal aorta:  No aneurysm visualized.  Other findings:  No ascites.  IMPRESSION: 1. Stable hepatic steatosis without overt cirrhotic abnormalities or focal masses. 2. Significant splenomegaly.   Electronically Signed   By: Irish LackGlenn  [name] M.D.   On: 05/10/2013 17:24    Microbiology: No  results found for this or any previous visit (from the past 240 hour(s)).   Labs: Basic Metabolic Panel:  Recent Labs Lab 05/10/13 1107 05/11/13 0148  NA 137 137  K 4.0 3.7  CL 100 101  CO2 23 24  GLUCOSE 313* 221*  BUN 9 9  CREATININE 0.58 0.56  CALCIUM 9.3 9.0   Liver Function Tests:  Recent Labs Lab 05/10/13 1107 05/11/13 0148  AST 40* 34  ALT 34 29  ALKPHOS 223* 209*  BILITOT 2.1* 1.5*  PROT 7.8 7.1  ALBUMIN 3.2* 2.9*    Recent Labs Lab 05/10/13 1328  LIPASE 34   No results found for this basename: AMMONIA,  in the last 168 hours CBC:  Recent Labs Lab 05/10/13 1107 05/11/13 0148  WBC 4.4 3.7*  NEUTROABS 2.6  --   HGB 14.9 14.4  HCT 41.7 39.6  MCV 95.4 94.5  PLT 90* 79*   Cardiac Enzymes:  Recent Labs Lab 05/10/13 1107 05/10/13 1328 05/10/13 1954 05/11/13 0148  TROPONINI <0.30 <0.30 <0.30 <0.30   BNP: BNP (last 3 results)  Recent Labs  05/10/13 1328  PROBNP 15.5   CBG:  Recent Labs Lab 05/10/13  1751 05/10/13 2129 05/11/13 0729 05/11/13 1117  GLUCAP 197* 202* 185* 215*       Signed:  Henderson Cloud  [facility] Pager: [phone] 05/11/2013, 12:57 PM

## 2013-05-11 NOTE — Progress Notes (Signed)
Patient with orders to be discharge home. Discharge instructions given, patient verbalized understanding. Patient stable. Patient left in private vehicle.  

## 2013-05-11 NOTE — Care Management Note (Signed)
    Page 1 of 1   05/11/2013     1:02:12 PM CARE MANAGEMENT NOTE 05/11/2013  Patient:  [name],[name]   Account Number:  [id number]  Date Initiated:  05/11/2013  Documentation initiated by:  Rosemary HolmsOBSON,Jeramine Delis  Subjective/Objective Assessment:   Pt admitted form home. Apparently between jobs and has no insurance. Pt does have a PCP. No anticipated HH needs.     Action/Plan:   Anticipated DC Date:  05/11/2013   Anticipated DC Plan:  HOME/SELF CARE      DC Planning Services  CM consult      Choice offered to / List presented to:             Status of service:  Completed, signed off Medicare Important Message given?   (If response is "NO", the following Medicare IM given date fields will be blank) Date Medicare IM given:   Date Additional Medicare IM given:    Discharge Disposition:    Per UR Regulation:    If discussed at Long Length of Stay Meetings, dates discussed:    Comments:  05/11/13 Rosemary HolmsAmy Mcgwire Dasaro RN BSN CM

## 2013-06-04 NOTE — Care Management Utilization Note (Signed)
UR completed 

## 2013-06-07 NOTE — Care Management Utilization Note (Signed)
UR completed 

## 2014-08-26 ENCOUNTER — Inpatient Hospital Stay: Payer: Self-pay

## 2014-08-26 ENCOUNTER — Inpatient Hospital Stay
Admission: EM | Admit: 2014-08-26 | Discharge: 2014-08-27 | DRG: 065 | Disposition: A | Discharge status: Home Health | Payer: Worker's Comp, Other unspecified | Attending: Internal Medicine | Admitting: Internal Medicine

## 2014-08-26 ENCOUNTER — Emergency Department: Payer: Self-pay

## 2014-08-26 ENCOUNTER — Ambulatory Visit (INDEPENDENT_AMBULATORY_CARE_PROVIDER_SITE_OTHER): Payer: Self-pay

## 2014-08-26 ENCOUNTER — Observation Stay: Payer: Self-pay

## 2014-08-26 ENCOUNTER — Inpatient Hospital Stay: Payer: Self-pay | Admitting: Internal Medicine

## 2014-08-26 DIAGNOSIS — R51 Headache: Secondary | ICD-10-CM | POA: Diagnosis present

## 2014-08-26 DIAGNOSIS — Z6832 Body mass index (BMI) 32.0-32.9, adult: Secondary | ICD-10-CM

## 2014-08-26 DIAGNOSIS — R748 Abnormal levels of other serum enzymes: Secondary | ICD-10-CM

## 2014-08-26 DIAGNOSIS — R17 Unspecified jaundice: Secondary | ICD-10-CM

## 2014-08-26 DIAGNOSIS — Z79899 Other long term (current) drug therapy: Secondary | ICD-10-CM

## 2014-08-26 DIAGNOSIS — R7401 Elevation of levels of liver transaminase levels: Secondary | ICD-10-CM

## 2014-08-26 DIAGNOSIS — F1010 Alcohol abuse, uncomplicated: Secondary | ICD-10-CM | POA: Diagnosis present

## 2014-08-26 DIAGNOSIS — D696 Thrombocytopenia, unspecified: Secondary | ICD-10-CM

## 2014-08-26 DIAGNOSIS — K7460 Unspecified cirrhosis of liver: Secondary | ICD-10-CM | POA: Diagnosis present

## 2014-08-26 DIAGNOSIS — Q211 Atrial septal defect: Secondary | ICD-10-CM

## 2014-08-26 DIAGNOSIS — K7010 Alcoholic hepatitis without ascites: Secondary | ICD-10-CM | POA: Diagnosis present

## 2014-08-26 DIAGNOSIS — R791 Abnormal coagulation profile: Secondary | ICD-10-CM | POA: Diagnosis present

## 2014-08-26 DIAGNOSIS — I63332 Cerebral infarction due to thrombosis of left posterior cerebral artery: Principal | ICD-10-CM | POA: Diagnosis present

## 2014-08-26 DIAGNOSIS — E669 Obesity, unspecified: Secondary | ICD-10-CM | POA: Diagnosis present

## 2014-08-26 DIAGNOSIS — G459 Transient cerebral ischemic attack, unspecified: Secondary | ICD-10-CM | POA: Diagnosis present

## 2014-08-26 DIAGNOSIS — R739 Hyperglycemia, unspecified: Secondary | ICD-10-CM

## 2014-08-26 DIAGNOSIS — I071 Rheumatic tricuspid insufficiency: Secondary | ICD-10-CM | POA: Diagnosis present

## 2014-08-26 DIAGNOSIS — I63342 Cerebral infarction due to thrombosis of left cerebellar artery: Secondary | ICD-10-CM

## 2014-08-26 DIAGNOSIS — I639 Cerebral infarction, unspecified: Secondary | ICD-10-CM | POA: Diagnosis present

## 2014-08-26 DIAGNOSIS — Z87891 Personal history of nicotine dependence: Secondary | ICD-10-CM

## 2014-08-26 DIAGNOSIS — E119 Type 2 diabetes mellitus without complications: Secondary | ICD-10-CM | POA: Diagnosis present

## 2014-08-26 DIAGNOSIS — E1165 Type 2 diabetes mellitus with hyperglycemia: Secondary | ICD-10-CM | POA: Diagnosis present

## 2014-08-26 DIAGNOSIS — R202 Paresthesia of skin: Secondary | ICD-10-CM

## 2014-08-26 DIAGNOSIS — I10 Essential (primary) hypertension: Secondary | ICD-10-CM | POA: Diagnosis present

## 2014-08-26 DIAGNOSIS — E785 Hyperlipidemia, unspecified: Secondary | ICD-10-CM | POA: Diagnosis present

## 2014-08-26 DIAGNOSIS — IMO0001 Reserved for inherently not codable concepts without codable children: Secondary | ICD-10-CM

## 2014-08-26 DIAGNOSIS — D6959 Other secondary thrombocytopenia: Secondary | ICD-10-CM | POA: Diagnosis present

## 2014-08-26 DIAGNOSIS — R7989 Other specified abnormal findings of blood chemistry: Secondary | ICD-10-CM | POA: Diagnosis present

## 2014-08-26 DIAGNOSIS — Z8249 Family history of ischemic heart disease and other diseases of the circulatory system: Secondary | ICD-10-CM

## 2014-08-26 DIAGNOSIS — Z823 Family history of stroke: Secondary | ICD-10-CM

## 2014-08-26 DIAGNOSIS — I671 Cerebral aneurysm, nonruptured: Secondary | ICD-10-CM | POA: Diagnosis present

## 2014-08-26 DIAGNOSIS — R200 Anesthesia of skin: Secondary | ICD-10-CM | POA: Diagnosis present

## 2014-08-26 HISTORY — DX: Type 2 diabetes mellitus without complications: E11.9

## 2014-08-26 LAB — COMPREHENSIVE METABOLIC PANEL
ALT: 51 U/L (ref 0–55)
ALT: 43 U/L (ref 0–55)
AST (SGOT): 59 U/L — ABNORMAL HIGH (ref 5–34)
AST (SGOT): 52 U/L — ABNORMAL HIGH (ref 5–34)
Albumin/Globulin Ratio: 0.8 — ABNORMAL LOW (ref 0.9–2.2)
Albumin/Globulin Ratio: 0.7 — ABNORMAL LOW (ref 0.9–2.2)
Albumin: 3.3 g/dL — ABNORMAL LOW (ref 3.5–5.0)
Albumin: 3 g/dL — ABNORMAL LOW (ref 3.5–5.0)
Alkaline Phosphatase: 322 U/L — ABNORMAL HIGH (ref 38–106)
Alkaline Phosphatase: 278 U/L — ABNORMAL HIGH (ref 38–106)
Anion Gap: 9 (ref 5.0–15.0)
Anion Gap: 7 (ref 5.0–15.0)
BUN: 9.8 mg/dL (ref 9.0–28.0)
BUN: 8.9 mg/dL — ABNORMAL LOW (ref 9.0–28.0)
Bilirubin, Total: 2.2 mg/dL — ABNORMAL HIGH (ref 0.2–1.2)
Bilirubin, Total: 2 mg/dL — ABNORMAL HIGH (ref 0.2–1.2)
CO2: 22 mEq/L (ref 22–29)
CO2: 23 mEq/L (ref 22–29)
Calcium: 9.9 mg/dL (ref 8.5–10.5)
Calcium: 9.1 mg/dL (ref 8.5–10.5)
Chloride: 108 mEq/L (ref 100–111)
Chloride: 108 mEq/L (ref 100–111)
Creatinine: 0.8 mg/dL (ref 0.7–1.3)
Creatinine: 0.8 mg/dL (ref 0.7–1.3)
Globulin: 4.4 g/dL — ABNORMAL HIGH (ref 2.0–3.6)
Globulin: 4.1 g/dL — ABNORMAL HIGH (ref 2.0–3.6)
Glucose: 294 mg/dL — ABNORMAL HIGH (ref 70–100)
Glucose: 231 mg/dL — ABNORMAL HIGH (ref 70–100)
Potassium: 4.1 mEq/L (ref 3.5–5.1)
Potassium: 3.9 mEq/L (ref 3.5–5.1)
Protein, Total: 7.7 g/dL (ref 6.0–8.3)
Protein, Total: 7.1 g/dL (ref 6.0–8.3)
Sodium: 139 mEq/L (ref 136–145)
Sodium: 138 mEq/L (ref 136–145)

## 2014-08-26 LAB — ECG 12-LEAD
Atrial Rate: 92 {beats}/min
P Axis: 65 degrees
P-R Interval: 164 ms
Q-T Interval: 374 ms
QRS Duration: 92 ms
QTC Calculation (Bezet): 462 ms
R Axis: 29 degrees
T Axis: 73 degrees
Ventricular Rate: 92 {beats}/min

## 2014-08-26 LAB — URINALYSIS, REFLEX TO MICROSCOPIC EXAM IF INDICATED
Bilirubin, UA: NEGATIVE
Blood, UA: NEGATIVE
Glucose, UA: >=500 — AB
Ketones UA: NEGATIVE
Leukocyte Esterase, UA: NEGATIVE
Nitrite, UA: NEGATIVE
Protein, UR: NEGATIVE
Specific Gravity UA: 1.03 (ref 1.001–1.035)
Urine pH: 7 (ref 5.0–8.0)
Urobilinogen, UA: 4 mg/dL — AB (ref 0.2–2.0)

## 2014-08-26 LAB — CBC AND DIFFERENTIAL
Basophils Absolute Automated: 0.09 10*3/uL (ref 0.00–0.20)
Basophils Automated: 2 %
Eosinophils Absolute Automated: 0.2 10*3/uL (ref 0.00–0.70)
Eosinophils Automated: 4 %
Hematocrit: 45.6 % (ref 42.0–52.0)
Hgb: 15.8 g/dL (ref 13.0–17.0)
Immature Granulocytes Absolute: 0.03 10*3/uL
Immature Granulocytes: 1 %
Lymphocytes Absolute Automated: 1.3 10*3/uL (ref 0.50–4.40)
Lymphocytes Automated: 24 %
MCH: 34.6 pg — ABNORMAL HIGH (ref 28.0–32.0)
MCHC: 34.6 g/dL (ref 32.0–36.0)
MCV: 99.8 fL (ref 80.0–100.0)
MPV: 11.5 fL (ref 9.4–12.3)
Monocytes Absolute Automated: 0.66 10*3/uL (ref 0.00–1.20)
Monocytes: 12 %
Neutrophils Absolute: 3.25 10*3/uL (ref 1.80–8.10)
Neutrophils: 59 %
Platelets: 90 10*3/uL — ABNORMAL LOW (ref 140–400)
RBC: 4.57 10*6/uL — ABNORMAL LOW (ref 4.70–6.00)
RDW: 14 % (ref 12–15)
WBC: 5.5 10*3/uL (ref 3.50–10.80)

## 2014-08-26 LAB — PT/INR
PT INR: 1.2
PT: 15.6 s — ABNORMAL HIGH (ref 12.6–15.0)

## 2014-08-26 LAB — LIPID PANEL
Cholesterol / HDL Ratio: 4.4
Cholesterol: 234 mg/dL — ABNORMAL HIGH (ref 0–199)
HDL: 53 mg/dL (ref 40–9999)
LDL Calculated: 136 mg/dL — ABNORMAL HIGH (ref 0–99)
Triglycerides: 226 mg/dL — ABNORMAL HIGH (ref 34–149)
VLDL Calculated: 45 mg/dL — ABNORMAL HIGH (ref 10–40)

## 2014-08-26 LAB — GFR
EGFR: >60
EGFR: >60

## 2014-08-26 LAB — MAGNESIUM
Magnesium: 2.1 mg/dL (ref 1.6–2.6)
Magnesium: 1.9 mg/dL (ref 1.6–2.6)

## 2014-08-26 LAB — RAPID DRUG SCREEN, URINE
Barbiturate Screen, UR: NEGATIVE
Benzodiazepine Screen, UR: NEGATIVE
Cannabinoid Screen, UR: NEGATIVE
Cocaine, UR: NEGATIVE
Opiate Screen, UR: NEGATIVE
PCP Screen, UR: NEGATIVE
Urine Amphetamine Screen: NEGATIVE

## 2014-08-26 LAB — HEPATITIS B SURFACE ANTIGEN W/ REFLEX TO CONFIRMATION: Hepatitis B Surface Antigen: NONREACTIVE

## 2014-08-26 LAB — GLUCOSE WHOLE BLOOD - POCT
Whole Blood Glucose POCT: 268 mg/dL — ABNORMAL HIGH (ref 70–100)
Whole Blood Glucose POCT: 240 mg/dL — ABNORMAL HIGH (ref 70–100)
Whole Blood Glucose POCT: 224 mg/dL — ABNORMAL HIGH (ref 70–100)

## 2014-08-26 LAB — TROPONIN I
Troponin I: <0.01 ng/mL (ref 0.00–0.09)
Troponin I: <0.01 ng/mL (ref 0.00–0.09)
Troponin I: <0.01 ng/mL (ref 0.00–0.09)

## 2014-08-26 LAB — HEPATITIS C ANTIBODY: Hepatitis C, AB: NONREACTIVE

## 2014-08-26 LAB — HEMOLYSIS INDEX: Hemolysis Index: 7 (ref 0–18)

## 2014-08-26 LAB — APTT: PTT: 31 s (ref 23–37)

## 2014-08-26 LAB — HEMOGLOBIN A1C: Hemoglobin A1C: 7.2 % — ABNORMAL HIGH (ref 4.6–5.9)

## 2014-08-26 LAB — LACTIC ACID, PLASMA: Lactic Acid: 1.4 mmol/L (ref 0.2–2.0)

## 2014-08-26 MED ORDER — GLUCAGON 1 MG IJ SOLR (WRAP)
1.0000 mg | INTRAMUSCULAR | Status: DC | PRN
Start: 2014-08-26 — End: 2014-08-27

## 2014-08-26 MED ORDER — SODIUM CHLORIDE 0.9 % IV BOLUS
1000.0000 mL | Freq: Once | INTRAVENOUS | Status: AC
Start: 2014-08-26 — End: 2014-08-26
  Administered 2014-08-26: 1000 mL via INTRAVENOUS

## 2014-08-26 MED ORDER — ASPIRIN 81 MG PO CHEW
324.0000 mg | CHEWABLE_TABLET | Freq: Once | ORAL | Status: AC
Start: 2014-08-26 — End: 2014-08-26
  Administered 2014-08-26: 324 mg via ORAL
  Filled 2014-08-26: qty 4

## 2014-08-26 MED ORDER — SODIUM CHLORIDE 0.9 % IV SOLN
INTRAVENOUS | Status: DC
Start: 2014-08-26 — End: 2014-08-26

## 2014-08-26 MED ORDER — DEXTROSE 50 % IV SOLN
25.0000 mL | INTRAVENOUS | Status: DC | PRN
Start: 2014-08-26 — End: 2014-08-27

## 2014-08-26 MED ORDER — ASPIRIN 81 MG PO CHEW
81.0000 mg | CHEWABLE_TABLET | Freq: Every day | ORAL | Status: DC
Start: 2014-08-27 — End: 2014-08-26

## 2014-08-26 MED ORDER — LABETALOL HCL 5 MG/ML IV SOLN
10.0000 mg | INTRAVENOUS | Status: DC | PRN
Start: 2014-08-26 — End: 2014-08-27

## 2014-08-26 MED ORDER — ASPIRIN 325 MG PO TBEC
325.0000 mg | DELAYED_RELEASE_TABLET | Freq: Every day | ORAL | Status: DC
Start: 2014-08-26 — End: 2014-08-26

## 2014-08-26 MED ORDER — ONDANSETRON 4 MG PO TBDP
4.0000 mg | ORAL_TABLET | Freq: Three times a day (TID) | ORAL | Status: DC | PRN
Start: 2014-08-26 — End: 2014-08-27

## 2014-08-26 MED ORDER — INSULIN ASPART 100 UNIT/ML SC SOLN
1.0000 [IU] | Freq: Three times a day (TID) | SUBCUTANEOUS | Status: DC | PRN
Start: 2014-08-26 — End: 2014-08-27
  Administered 2014-08-26: 2 [IU] via SUBCUTANEOUS
  Administered 2014-08-26 – 2014-08-27 (×2): 3 [IU] via SUBCUTANEOUS
  Filled 2014-08-26: qty 1
  Filled 2014-08-26: qty 2

## 2014-08-26 MED ORDER — FOLIC ACID 5 MG/ML IJ SOLN
Freq: Once | INTRAMUSCULAR | Status: DC
Start: 2014-08-26 — End: 2014-08-26

## 2014-08-26 MED ORDER — ATORVASTATIN CALCIUM 20 MG PO TABS
40.0000 mg | ORAL_TABLET | Freq: Every evening | ORAL | Status: DC
Start: 2014-08-26 — End: 2014-08-26

## 2014-08-26 MED ORDER — METFORMIN HCL 500 MG PO TABS
1000.0000 mg | ORAL_TABLET | Freq: Two times a day (BID) | ORAL | Status: DC
Start: 2014-08-26 — End: 2014-08-27
  Administered 2014-08-26 – 2014-08-27 (×2): 1000 mg via ORAL
  Filled 2014-08-26 (×2): qty 2

## 2014-08-26 MED ORDER — THIAMINE HCL 100 MG PO TABS
100.0000 mg | ORAL_TABLET | Freq: Every day | ORAL | Status: DC
Start: 2014-08-26 — End: 2014-08-27
  Administered 2014-08-26 – 2014-08-27 (×2): 100 mg via ORAL
  Filled 2014-08-26 (×2): qty 1

## 2014-08-26 MED ORDER — FOLIC ACID 1 MG PO TABS
1.0000 mg | ORAL_TABLET | Freq: Every day | ORAL | Status: DC
Start: 2014-08-26 — End: 2014-08-27
  Administered 2014-08-26 – 2014-08-27 (×2): 1 mg via ORAL
  Filled 2014-08-26 (×2): qty 1

## 2014-08-26 MED ORDER — ASPIRIN 325 MG PO TBEC
325.0000 mg | DELAYED_RELEASE_TABLET | Freq: Every day | ORAL | Status: DC
Start: 2014-08-27 — End: 2014-08-27
  Administered 2014-08-27: 325 mg via ORAL
  Filled 2014-08-26: qty 1

## 2014-08-26 MED ORDER — PANTOPRAZOLE SODIUM 40 MG PO TBEC
40.0000 mg | DELAYED_RELEASE_TABLET | Freq: Every morning | ORAL | Status: DC
Start: 2014-08-26 — End: 2014-08-27
  Administered 2014-08-26 – 2014-08-27 (×2): 40 mg via ORAL
  Filled 2014-08-26 (×2): qty 1

## 2014-08-26 MED ORDER — ENOXAPARIN SODIUM 40 MG/0.4ML SC SOLN
40.0000 mg | Freq: Every day | SUBCUTANEOUS | Status: DC
Start: 2014-08-26 — End: 2014-08-26
  Filled 2014-08-26: qty 0.4

## 2014-08-26 MED ORDER — ONDANSETRON HCL 4 MG/2ML IJ SOLN
4.0000 mg | Freq: Three times a day (TID) | INTRAMUSCULAR | Status: DC | PRN
Start: 2014-08-26 — End: 2014-08-27

## 2014-08-26 MED ORDER — LORAZEPAM 2 MG/ML IJ SOLN
2.0000 mg | INTRAMUSCULAR | Status: DC | PRN
Start: 2014-08-26 — End: 2014-08-27

## 2014-08-26 NOTE — ED Provider Notes (Signed)
Physician/Midlevel provider first contact with patient: 08/26/14 0519         History     Chief Complaint   Patient presents with   . Numbness In Extremities     R side     HPI Comments: Pt with right arm and leg altered sens (cant feel hot or cold)  for approx 17 hours (noon yest) with no similar but milder right leg sxs in past with no eval- hx of dm- no other sxs    pcp in [place]    Patient is a 54 y.o. male presenting with neurologic complaint. The history is provided by the patient.   Neurologic Problem  The primary symptoms include paresthesias. Primary symptoms do not include headaches, loss of consciousness, fever, nausea or vomiting. The symptoms began 12 to 24 hours ago (yest am). The episode lasted 17 hours (approx). The symptoms are unchanged. The neurological symptoms are focal.   Additional symptoms do not include weakness or hearing loss.            Past Medical History   Diagnosis Date   . Diabetes mellitus        History reviewed. No pertinent past surgical history.    History reviewed. No pertinent family history.    Social  Social History   Substance Use Topics   . Smoking status: Former Smoker     Quit date: 08/25/1996   . Smokeless tobacco: Never Used   . Alcohol Use: 15.0 oz/week     25 Cans of beer per week       .     No Known Allergies    Home Medications                   metFORMIN (GLUCOPHAGE) 1000 MG tablet     Take 1,000 mg by mouth 2 (two) times daily with meals.     UNKNOWN TO PATIENT                Review of Systems   Constitutional: Negative for fever and activity change.   HENT: Negative for congestion, hearing loss, rhinorrhea, sore throat, trouble swallowing and voice change.    Eyes: Negative for visual disturbance.   Respiratory: Negative for cough and shortness of breath.    Cardiovascular: Negative for chest pain.   Gastrointestinal: Negative for nausea, vomiting, abdominal pain, diarrhea and constipation.   Genitourinary: Negative for dysuria and difficulty  urinating.   Musculoskeletal: Negative for back pain and neck pain.        No trauma   Skin: Negative for color change and rash.   Neurological: Positive for numbness (cant feel hot or cold but can feel) and paresthesias. Negative for loss of consciousness, syncope, speech difficulty, weakness and headaches.   All other systems reviewed and are negative.      Physical Exam    BP: (!) 191/92 mmHg, Heart Rate: 100, Temp: 97.9 F (36.6 C), Resp Rate: 16, SpO2: 99 %, Weight: 117.935 kg    Physical Exam   Constitutional: He is oriented to person, place, and time. He appears well-developed and well-nourished.  Non-toxic appearance. He does not have a sickly appearance. He does not appear ill. He appears distressed.   Elevated bp   HENT:   Head: Normocephalic and atraumatic.   Right Ear: External ear normal.   Left Ear: External ear normal.   Nose: Nose normal.   Mouth/Throat: Oropharynx is clear and moist and mucous membranes are  normal.   Eyes: EOM and lids are normal. Pupils are equal, round, and reactive to light.   Neck: Trachea normal and full passive range of motion without pain. No spinous process tenderness present.   Cardiovascular: Normal rate, regular rhythm, normal heart sounds and normal pulses.    Pulmonary/Chest: Effort normal and breath sounds normal.   Abdominal: Soft. Bowel sounds are normal. There is no tenderness. There is no rebound and no guarding.   Musculoskeletal: Normal range of motion.        Thoracic back: He exhibits normal range of motion, no tenderness, no bony tenderness and no pain.        Lumbar back: He exhibits normal range of motion, no tenderness, no bony tenderness and no pain.   nt ext with full rom and nvi   Lymphadenopathy:     He has no cervical adenopathy.   Neurological: He is alert and oriented to person, place, and time. He has normal strength. No cranial nerve deficit or sensory deficit. GCS eye subscore is 4. GCS verbal subscore is 5. GCS motor subscore is 6.   Skin: Skin  is warm, dry and intact. No rash noted.   Psychiatric: He has a normal mood and affect.   Nursing note and vitals reviewed.        MDM and ED Course     ED Medication Orders     Start Ordered     Status Ordering Provider    08/26/14 0547 08/26/14 0546  aspirin chewable tablet 324 mg   Once     Route: Oral  Ordered Dose: 324 mg     Last MAR action:  Given Letroy Vazguez    08/26/14 0521 08/26/14 0520  sodium chloride 0.9 % bolus 1,000 mL   Once     Route: Intravenous  Ordered Dose: 1,000 mL     Last MAR action:  New Bag Oryn Casanova             MDM  Number of Diagnoses or Management Options  Elevated alkaline phosphatase level:   Elevated AST (SGOT):   Elevated bilirubin:   Elevated BP:   Hyperglycemia:   Numbness and tingling of right arm and leg:   Thrombocytopenia:   Diagnosis management comments: Right sided paresthesia in diabetic without evid of dka- elev bp noted- sxs too prolonged, and mild, for tpa- w/u, support, med tele admit pending, asa to follow    I, Michaelle Copas, MD, have been the primary provider for [name] during this Emergency Dept visit.    Oxygen saturation by pulse oximetry is 95%-100%, Normal.  Interventions: None Needed    EKG Interpretation:    Rhythm:  Normal Sinus  Rate:  Normal, 92  Axis:  Normal  Conduction:  No blocks  ST/T Segments:  Non spec st/t changes  Other- no old ekg    cxr pending, support and med tele admit pending    Support and med tele admit pending, bp improved    Labs Reviewed  CBC AND DIFFERENTIAL - Abnormal; Notable for the following:      Platelets                     90 (*)                 RBC  4.57 (*)               MCH                           34.6 (*)            All other components within normal limits  COMPREHENSIVE METABOLIC PANEL - Abnormal; Notable for the following:      Glucose                       294 (*)                Albumin                       3.3 (*)                AST (SGOT)                    59  (*)                 Alkaline Phosphatase          322 (*)                Bilirubin, Total              2.2 (*)                Globulin                      4.4 (*)                Albumin/Globulin Ratio        0.8 (*)             All other components within normal limits  PT/INR - Abnormal; Notable for the following:      PT                            15.6 (*)            All other components within normal limits  URINALYSIS, REFLEX TO MICROSCOPIC EXAM IF INDICATED - Abnormal; Notable for the following:      Glucose, UA                   >=500 (*)               Urobilinogen, UA              4.0 (*)             All other components within normal limits  GLUCOSE WHOLE BLOOD - POCT - Abnormal; Notable for the following:      POCT - Glucose Whole blood    268 (*)             All other components within normal limits  MAGNESIUM  APTT  TROPONIN I  GFR    Radiology Results (24 Hour)     Procedure Component Value Units Date/Time    Chest AP Portable ([id number]) Collected:  08/26/14 0613    Order Status:  Completed Updated:  08/26/14 0619    Narrative:      INDICATION: stroke evaluation.  54 year old male with history of  diabetes. Suspected stroke.  COMPARISON: No prior chest x-ray with which to compare.          TECHNIQUE: XR CHEST AP PORTABLE: AP portable upright    FINDINGS:     No indwelling lines are appreciated. The patient is in  lordotic position.     The lungs are grossly clear. There is no  silhouetting of the diaphragms or cardio-mediastinal contours.  There is  no evidence of pulmonary edema.      There is no pneumothorax, pleural  effusion or other pleural pathology.      The heart is likely normal in  size when allowances are made for technical factors.      There is no  mediastinal shift and the upper mediastinum does not appear widened.       Hilar structures are within normal limits.       There is no apparent  subdiaphragmatic free air.          No significant bony lesions are  seen.                Impression:       Portable AP radiographic examination of the chest shows no  acute abnormality.                     Miguel Dibble, MD   08/26/2014 6:15 AM      CT Head WO Contrast ([id number]) Collected:  08/26/14 0541    Order Status:  Completed Updated:  08/26/14 0550    Narrative:      INDICATION: right side numbness.  54 year old male presents with  right-sided numbness and weakness. History of diabetes.                COMPARISON: No prior study for comparison.           TECHNIQUE:  CT HEAD WO CONTRAST: Axial imaging of the brain was  performed from base to vertex.  Scans were performed without IV  contrast.     The following dose reduction techniques were utilized:  automated exposure control and/or adjustment of the mA and/or kV  according to patient's size, and the use of iterative reconstruction  technique.    FINDINGS:      The scalp and calvarium appear normal without acute or  significant abnormalities.     No extra-axial lesions are identified.  There is apparent low density in the left cerebellar hemisphere.  Although this may represent artifact, the possibility of an old  cerebellar infarct might be considered. There is no mass effect in the  posterior fossa.     There is no midline shift or other significant mass  effect.  No intracranial hemorrhage is seen.  There is no hydrocephalus.   No acute territorial infarcts are identified.  The basal cisterns are  clear. Nonspecific lucency is seen in the periventricular white matter  which is likely the result of microischemia.  Atherosclerotic  calcifications are noted in the cavernous carotid arteries. The skull  base, middle ear structures, and mastoid air cells are grossly  unremarkable.      The visualized facial structures are essentially  unremarkable and show no acute abnormality. A retention cyst is noted in  the left maxillary sinus.                      Impression:       CT scan of the head shows no hydrocephalus, herniation, or  intracranial  hemorrhage. There is no visible acute intracranial  abnormality.       An acute stroke, if present, is not visible on this  examination.  Correlate clinically to determine whether further imaging  with MRI is indicated.  There are questionable findings in the posterior  fossa which suggests an old left cerebellar hemispheric infarct.  Other  findings as noted above.      Findings were discussed with Dr. Claiborne Rigg  at the following time: 08/26/2014 5:44 AM.                          Miguel Dibble, MD   08/26/2014 5:46 AM               Amount and/or Complexity of Data Reviewed  Clinical lab tests: reviewed and ordered  Tests in the radiology section of CPT: reviewed and ordered  Discuss the patient with other providers: yes (Dr Larina Bras will admit)              Stroke/tPA Management  Date/Time: 08/26/2014 5:48 AM  Performed by: Michaelle Copas  Authorized by: Michaelle Copas  Stroke/tPA: Best estimate of time since last normal exceeds the recommended window for tPA treatment..Reasons for not giving tPA at < 3 hours: Minimal deficit on NIHSS on arrival.        Clinical Impression & Disposition     Clinical Impression  Final diagnoses:   Elevated BP   Numbness and tingling of right arm and leg   Hyperglycemia   Thrombocytopenia   Elevated AST (SGOT)   Elevated bilirubin   Elevated alkaline phosphatase level        ED Disposition     Admit Bed Type: Telemetry [5]  Admitting Physician: Brynda Rim [[id number]]  Patient Class: Observation [104]             New Prescriptions    No medications on file                   Michaelle Copas, MD  08/26/14 [phone]

## 2014-08-26 NOTE — Progress Notes (Signed)
Admitted from ER L via stretcher accompanied by transporter. Oriented to the unit and plan of care.

## 2014-08-26 NOTE — Consults (Signed)
Service Date: 08/26/2014     Patient Type: I     CONSULTING PHYSICIAN: Bruna Potter MD     REFERRING PHYSICIAN: Edwina Barth MD     CHIEF COMPLAINT AND REASON FOR CONSULTATION:  Right-sided numbness.     HISTORY OF PRESENT ILLNESS:  [name] is a 54 year old right-handed male with a history of  diabetes who was admitted due to complaints of right-sided numbness.  The  patient reports he was in his usual state of health until yesterday  morning, when he began noticing numbness of his right arm and right leg.   He had no other neurologic symptoms.  He denies headache.  He reports no  blurred vision or double vision.  He denies any dizziness or vertigo.  He  denies any weakness.  He reported no problems with his speech or  swallowing.  He presented to the emergency department early this morning  due to persistence of his right-sided numbness.  A noncontrast head CT was  unremarkable.  The patient was admitted for further evaluation and  treatment.  An MRI of the brain was notable for an acute infarct in the  left lateral medulla.  MRA of the head and neck was notable for thrombosis  of the left posterior inferior cerebellar artery.  There is also a small  broad-based outpouching of the left ICA terminus, possibly representing an  aneurysm.  The patient reports no change in his symptoms since being  admitted to the hospital.  He denies any prior history of stroke or TIA.   He denies any other recent illnesses.  He mentions no other modifying  factors or associated signs or symptoms.     PAST MEDICAL HISTORY:  Diabetes.     PAST SURGICAL HISTORY:  Hemorrhoidectomy.     OUTPATIENT MEDICATIONS:  Metformin.     ALLERGIES:  No known drug allergies.     FAMILY HISTORY:  Noncontributory.     SOCIAL HISTORY:  Denies current tobacco use.  He reports that he quit smoking in 1998.  He  reports drinking at least 5 to 6 beers per day.  He is married.  He has 3  children.     REVIEW OF SYSTEMS:  As per HPI.  All  remaining systems negative.     PHYSICAL EXAMINATION:  VITAL SIGNS:  Blood pressure 164/88, pulse 88, respirations 20, temperature  97.8.  GENERAL:  Well-developed, well-nourished male in no acute distress.  HEENT:  Normocephalic, atraumatic.  Oropharynx clear.  NECK:  Supple.  There are no carotid bruits.  CARDIOVASCULAR:  Regular rate and rhythm.  EXTREMITIES:  No cyanosis, clubbing or edema.  NEUROLOGIC:  The patient is alert and oriented x3.  Speech is fluent.   There is no aphasia.  Attention span and concentration are normal.  Fund of  knowledge is normal.  Remote and recent memory are intact.  The pupils are  reactive to light.  Visual fields are full to confrontation.  Extraocular  movements are intact.  There is normal sensation on the face.  The face is  symmetric.  The palate goes up symmetrically.  Hearing is intact.  The  sternocleidomastoids are of equal strength.  The tongue is midline.  Motor  examination reveals normal tone throughout.  There is no pronator drift.   Strength is 5/5 throughout.  There is decreased sensation to light touch  and temperature on the right as compared to the left.  Coordination testing  is  normal.  Gait examination deferred.  DTRs are symmetric with downgoing  toes bilaterally     LABORATORY DATA:  CMP significant for glucose of 231, BUN 8.9, albumin 3.0.  AST 52, alkaline  phosphatase 278, total bilirubin 2.0.  CBC significant for a platelet count  of 90.     IMPRESSION:  1.  Acute cerebrovascular accident:  The patient's only symptomatology is  right-sided numbness.  For now, would continue the patient on aspirin.  The  patient is outside of the window for any potential invasive intervention.   Echocardiogram has been ordered.  2.  Diabetes.  3.  Alcohol abuse.  4.  Thrombocytopenia.  5.  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     RECOMMENDATIONS:  1.  Echocardiogram as ordered.  2.  Continue aspirin.  3.  Further  recommendations will depend upon the results of the above and  upon the patient's clinical course.           D:  08/26/2014 14:11 PM by Dr. Bruna Potter, MD ([id number])  T:  08/26/2014 14:31 PM by       Everlean Cherry: [id number]) (Doc ID: [id number])

## 2014-08-26 NOTE — UM Notes (Signed)
ED arrival date: 08/26/14 , IP order written date: 08/26/14    C/O: Patient is a 54 year old male came with the chief complaint of right sided decrease to sensation.    Past Medical History   Diagnosis Date   . Diabetes mellitus        VS:    97.9 F (36.6 C)  Oral  100  99 %  --  16   197/95 mmHg       Labs: Results for Jonathan Herrera, Jonathan Herrera (MRN [id number]) as of 08/26/2014 17:07   Ref. Range 08/26/2014 05:29   Platelet Count Latest Ref Range: 140-400 x10 3/uL 90 (L)   RBC Latest Ref Range: 4.70-6.00 x10 6/uL 4.57 (L)   MCH, POC Latest Ref Range: 28.0-32.0 pg 34.6 (H)   Glucose Latest Ref Range: 70-100 mg/dL 811 (H)   POCT - Glucose Whole blood Latest Ref Range: 70-100 mg/dL 914 (H)   AST (SGOT) Latest Ref Range: 5-34 U/L 59 (H)   Alkaline Phosphatase Latest Ref Range: 38-106 U/L 322 (H)   Albumin Latest Ref Range: 3.5-5.0 g/dL 3.3 (L)   Globulin Latest Ref Range: 2.0-3.6 g/dL 4.4 (H)   Albumin/Globulin Ratio Latest Ref Range: 0.9-2.2  0.8 (L)   Bilirubin, Total Latest Ref Range: 0.2-1.2 mg/dL 2.2 (H)   Troponin I Latest Ref Range: 0.00-0.09 ng/mL <0.01   PT Latest Ref Range: 12.6-15.0 sec 15.6 (H)   Results for Jonathan Herrera, Jonathan Herrera (MRN [id number]) as of 08/26/2014 17:07   Ref. Range 08/26/2014 10:56   Cholesterol Latest Ref Range: 0-199 mg/dL 308 (H)   Calculated LDL Latest Ref Range: 0-99 mg/dL 657 (H)   Triglycerides Latest Ref Range: 34-149 mg/dL 846 (H)   VLDL Cholesterol Cal Latest Ref Range: 10-40 mg/dL 45 (H)   Results for Jonathan Herrera, Jonathan Herrera (MRN [id number]) as of 08/26/2014 17:07   Ref. Range 08/26/2014 05:29   Glucose, UA Latest Ref Range: Negative  >=500 (A)   Urobilinogen, UA Latest Ref Range: 0.2-2.0 mg/dL 4.0 (A)       Pending culture:     Radiology result: CT of head:CT scan of the head shows no hydrocephalus, herniation, or intracranial hemorrhage. There is no visible acute intracranial abnormality. An acute stroke, if present, is not visible on this examination. Correlate clinically to determine  whether further imaging  with MRI is indicated. There are questionable findings in the posterior fossa which suggests an old left cerebellar hemispheric infarct.     MRI of brain:1. 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 There is a chronic infarct  in the inferomedial left cerebellar hemisphere.  2. There is no acute intracranial hemorrhage or mass effect.  3. A small broad-based outpouching of the ICA terminus on the left is  suggestive of an aneurysm. A follow-up MRA in 6-12 months is recommended  to assess stability.  4. No significant stenosis is identified in the cervical vasculature.    ED meds given: NS bolus x1 liter, ASA pox1.     Admitted to IP status , Dx: CVA (cerebral vascular accident)    Diet: NPO  Stroke protocal.     Meds: Lovenox SQ daily, NS @75cc /hr, SSI.     Consult: neurology, PT/ OT    MD Note/Treatment plan:   Acute CVA with right-sided paresthesia. Patient MRI of the brain showed acute infarct in the lateral medullary on the left side. He also has a chronic infarct on the  left cerebellar hemisphere. 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 Case has been discussed with Dr. Alfredo Martinez, the neurologist and also with Dr. Sherril Croon the neurosurgeon. Patient is not a candidate for any surgical intervention for removal of the clot. Not a candidate for anticoagulation with heparin or Coumadin because of low platelets and probable underlying liver disease with increased risk of bleeding. Dr. Alfredo Martinez has recommended only aspirin 325 mg for now. Await 2-D echocardiogram. Dr. 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 Follow up on hemoglobin A1c . Sliding scale     --- Uncontrolled  hypertension. Patient has no prior history of high blood pressure. We will continue to monitor the blood pressure. Will not lower acutely in the setting of acute stroke. 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 Patient has significant history of alcohol. He also has evidence of elevated PT and low platelets. Check hepatitis panel. Right upper quadrant ultrasound.     --- Alcohol abuse. Counseled him to stop alcohol. Place him on CIWA protocol and monitor. Thiamine and folic acid.     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 Monitor on ASA.

## 2014-08-26 NOTE — PT Eval Note (Signed)
[facility]  [address]  [place], [place]. [address]    Department of Rehabilitation  [phone]    Physical Therapy Evaluation    Patient: Jonathan Herrera    MRN#: [id number]     [id number]/[id number].A    Time of treatment: Time Calculation  PT Received On: 08/26/14  Start Time: 1449  Stop Time: 1510  Time Calculation (min): 21 min    PT Visit Number: 1    Consult received for Jonathan Herrera for PT Evaluation and Treatment.  Patient's medical condition is appropriate for Physical therapy intervention at this time.      Assessment:   Jonathan Herrera is a 54 y.o. male admitted 08/26/2014 presenting with acute CVA.     Impairments: Assessment: Decreased sensation;Appears to be at baseline for mobility.     Therapy Diagnosis: decreased sensation due to CVA.    Rehabilitation Potential: Prognosis: Good      Plan:    Treatment/Interventions: No skilled interventions needed at this time PT Frequency: one time visit    Risks/Benefits/POC Discussed with Pt/Family: With patient     Progress: Discontinue PT    Goals: n/a         Discharge Recommendations:   Based on today's session patient's discharge recommendation is the following: Home with supervision       Precautions and Contraindications: Fall risk        Medical Diagnosis: Thrombocytopenia [D69.6]  Hyperglycemia [R73.9]  Elevated BP [I10]  Elevated alkaline phosphatase level [R74.8]  Elevated bilirubin [R17]  Numbness and tingling of right arm and leg [R20.2]  Elevated AST (SGOT) [R74.0]    History of Present Illness: Jonathan Herrera is a 54 y.o. male admitted on 08/26/2014 with "right sided decrease to sensation.  Patient was taking shower yesterday when he felt that the water was hot on the left side and normal temperature on the right side.  He then went to work and he felt  the air conditioner was not running on the right side.  He also noticed that when he took a water bottle from the cooler  And felt like the room temperature on the right hand and  when he transferred to the left hand it was ice cold.  The symptoms did not get better at work and so he came into the ER.  In the ER, his blood pressure was significantly elevated .  Patient had complaints of headache for the past couple of days.  Currently, he does not have any headache.  No blurry vision or double vision.  No difficulty talking.  No difficulty swallowing.  No focal arm or leg weakness.  He felt dizzy yesterday  but now he does not have dizziness today" per H&P.    Patient Active Problem List   Diagnosis   . TIA (transient ischemic attack)   . Paresthesia of right upper and lower extremity   . Type 2 diabetes mellitus   . Abnormal LFTs   . Essential hypertension   . Alcohol abuse   . Thrombocytopenia   . CVA (cerebral vascular accident)   . 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 Diabetes mellitus       Past Surgical History   Procedure Laterality Date   . Hemorrhoidectomy  2009?         X-Rays/Tests/Labs:  MRI Brain  IMPRESSION:  1. 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 There is a chronic infarct  in the inferomedial left cerebellar hemisphere.  2. There is no acute intracranial hemorrhage or mass effect.  3. A small broad-based outpouching of the ICA terminus on the left is  suggestive of an aneurysm. A follow-up MRA in 6-12 months is recommended  to assess stability.  4. No significant stenosis is identified in the cervical vasculature.      Social History:  Prior Level of Function  Prior level of function: Independent with ADLs, Ambulates independently  Baseline Activity Level: Community ambulation  Driving: independent  Cooking: Yes  Employment: FT  Home Living Arrangements  Living Arrangements: Spouse/significant other  Type of Home: House (Pt lives in [place], is in [place] for work.)  Home Layout: One level, Performs ADL's on one  level  Bathroom Shower/Tub: Tub/shower unit  Home Living - Notes / Comments: Currently in [place] for work. 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 Nursing clears patient for therapy. No c/o pain currently.   Patient Goal  Patient Goal: To feel better       Objective:   Observation of Patient/Vital Signs:  Patient is in bed with telemetry in place.    Cognition/Neuro Status  Arousal/Alertness: Appropriate responses to stimuli  Attention Span: Appears intact  Orientation Level: Oriented X4  Memory: Appears intact  Following Commands: Follows all commands and directions without difficulty  Safety Awareness: minimal verbal instruction  Insights: Decreased awareness of deficits  Behavior: attentive;calm;cooperative  Motor Planning: intact  Coordination: intact     Vision: smooth pursuit intact, wears glasses  Sensation: impaired hot/cold UE/LE R. Intact light touch BUE/LE. Reported slight tingling R medial foot and posterior leg.    Gross ROM  Right Upper Extremity ROM: within functional limits  Left Upper Extremity ROM: within functional limits  Right Lower Extremity ROM: within functional limits  Left Lower Extremity ROM: within functional limits  Gross Strength  Right Upper Extremity Strength: 4+/5  Left Upper Extremity Strength: 4+/5  Right Lower Extremity Strength: 4+/5  Left Lower Extremity Strength: 4+/5       Functional Mobility  Supine to Sit: Modified Independent;HOB raised  Sit to Supine: Modified Independent  Sit to Stand: Supervision  Stand to Sit: Supervision     Locomotion  Ambulation: Supervision  Ambulation Distance (Feet): 200 Feet  Pattern: decreased cadence     Balance  Balance: within functional limits    Participation and Endurance  Participation Effort: good  Endurance: Tolerates 10 - 20 min exercise with multiple rests. No c/o dizziness or SOB during mobility.    Treatment Activities: Ambulated to/from bathroom in hallway with  supervision. Pt able to perform all toileting and sinkside tasks independently. No LOB noted during ambulation. Pt reported continued decreased sensation RUE, especially with temperatures. Educated on importance of being aware of where RUE is due to decreased sensations, and ensuring he tests temperatures or uses sharp objects with L hand. Instructed to check shoes and feet daily to ensure no cuts or sharp objects in shoes especially after work. Educated on sensitization training using different textures and applying to R/L at different times to retrain brain connections. Encouraged to follow up with neurologist in [place] and get recommendations for follow up therapy as needed. Pt asked about return to work  and encouraged to speak with MD regarding any restrictions. Educated in signs/sx CVA and instructed to return to ED if symptoms occur. Educated on importance of stress and BP management, proper diet and hydration and gradual return to heavy activities to decrease risk of future neurologic events. Pt verbalized understanding for all education provided.    Educated the patient to role of physical therapy, plan of care, goals of therapy and HEP, safety with mobility and ADLs, home safety.    At end of session pt supine in bed, call bell and items in reach. RN aware.      Delfin Edis, PT, DPT  Pager #: [phone]

## 2014-08-26 NOTE — Plan of Care (Signed)
Problem: Health Promotion  Goal: Vaccination Screening  All patients will be screened for current vaccination status on each admission.   Outcome: Completed Date Met:  08/26/14  Goal: Knowledge - disease process  Extent of understanding conveyed about a specific disease process.   Outcome: Progressing  Goal: Knowledge - health resources  Extent of understanding and conveyed about healthcare resources.   Outcome: Progressing    Problem: Safety  Goal: Patient will be free from injury during hospitalization  Outcome: Progressing    Problem: Pain  Goal: Patient's pain/discomfort is manageable  Outcome: Progressing    Problem: Psychosocial and Spiritual Needs  Goal: Demonstrates ability to cope with hospitalization/illness  Outcome: Progressing    Problem: Day of Admission- Stroke  Goal: Neurological status is stable or improving  Outcome: Progressing  Goal: Stable vital signs and fluid balance  Outcome: Progressing  Goal: Patient will maintain Adequate Oxygenation  Outcome: Progressing  Goal: Patients risk of aspiration will be minimized  Outcome: Progressing  Goal: Nutritional Status Improving  Outcome: Progressing  Goal: Elimination patterns are normal or improving  Outcome: Progressing  Goal: Mobility/activity is maintained at optimum level for patient  Outcome: Progressing  Goal: Skin integrity is maintained or improved  Outcome: Progressing  Goal: Neurovascular Status is Stable or Improving  Outcome: Progressing  Goal: Effective coping demonstrated  Outcome: Progressing

## 2014-08-26 NOTE — Plan of Care (Signed)
Pain Management Plan    Education about your Pain Management.    Dear [name],    It is my pleasure to care for you during your hospitalization here at [facility]. You have reported that you are not experiencing pain at this time. If at any point you experience pain, please notify a member of the healthcare team. We are committed to meeting your unique needs during your hospitalization and can adjust your plan of care accordingly.      Thank you for your time.    Rosemary Holms, RN  08/26/2014  11:25 AM  [facility]  [address]  [place], [place]  [address]

## 2014-08-26 NOTE — H&P (Signed)
[facility] HOSPITALIST  H&P    Patient Info:   Date Time: 08/26/2014  9:27 AM   Patient Name:Jonathan Herrera   AVW:[id number]    PCP: Christa See, MD   Admit Date:08/26/2014   Attending Physician:Shaheen Mende V,*      Assessment and Plan:     Patient is a 54 year old male being admitted to the hospital for right-sided paresthesia     --- Acute CVA with right-sided paresthesia.  Patient MRI of the brain showed acute infarct in the lateral medullary on the left side.  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  Case has been discussed with Dr. Alfredo Martinez, the neurologist and also with Dr. Sherril Croon  the neurosurgeon.  Patient is not a candidate for any surgical intervention for removal of the clot.  Not a candidate for anticoagulation with heparin or Coumadin because of low platelets and probable underlying liver disease with increased risk of bleeding.  Dr. Alfredo Martinez  has recommended only aspirin 325 mg for now.  Await 2-D echocardiogram.  Dr. 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  Follow up on hemoglobin A1c . Sliding scale     --- Uncontrolled hypertension.  Patient has no prior history of high blood pressure.  We will continue to monitor the blood pressure.  Will not lower acutely in the setting of acute stroke.  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  Patient has significant history of alcohol.  He also has evidence of elevated PT and low platelets.  Check hepatitis panel.  Right upper quadrant ultrasound.      --- Alcohol abuse.  Counseled him to stop alcohol.  Place him on CIWA protocol and monitor.  Thiamine and folic acid.      ---  Thrombocytopenia due to alcohol mostly . Monitor on ASA.        Hospital Problems:  Active Problems:    TIA (transient ischemic attack)    Paresthesia of right upper and lower extremity    Type 2 diabetes mellitus    Abnormal LFTs    Essential hypertension    Alcohol abuse    Thrombocytopenia     DVT Prohylaxis:SEDs .  No Lovenox due to low platelets    Code Status: Full Code   Disposition:home   Condition on admission and Prognosis:Guarded    Type of Admission:Observation   Estimated Length of Stay (including stay in the ER receiving treatment): greater than 2 midnights    Medical Necessity for stay:Acute CVA        Clinical Presentation   History of Presenting Illness:   Jonathan Herrera is a 54 y.o. male who has history of   Past Surgical History   Procedure Laterality Date   . Hemorrhoidectomy  2009?    Past Medical History   Diagnosis Date   . Diabetes mellitus     came with the chief complaint of right sided decrease to sensation.  Patient was taking shower yesterday when he felt that the water was hot on the left side and normal temperature on the right side.  He then went to work and he felt  the air conditioner was not running on the right side.  He also noticed that when he took a water bottle from the cooler  And felt like the room temperature on the right hand and when he transferred to the left hand it was ice cold.  The symptoms did not get better at work and so he came into the ER.  In the ER, his blood pressure was significantly elevated .  Patient had complaints of headache for the past couple of days.  Currently, he does not have any headache.  No blurry vision or double vision.  No difficulty talking.  No difficulty swallowing.  No focal arm or leg weakness.  He felt dizzy yesterday  but now he does not have dizziness today..     Review of Systems:    Review of Systems   Constitutional: Negative for fever, chills, weight loss and diaphoresis.   HENT: Negative for congestion and sore throat.     Eyes: Negative for blurred vision and double vision.   Respiratory: Negative for cough, sputum production and shortness of breath.    Cardiovascular: Negative for chest pain and palpitations.   Gastrointestinal: Negative for nausea, vomiting, abdominal pain, diarrhea and constipation.   Genitourinary: Negative for urgency and frequency.   Musculoskeletal: Negative for myalgias and back pain.   Skin: Negative for itching and rash.   Neurological: Positive for sensory change and headaches. Negative for dizziness, tingling, tremors, speech change, seizures and loss of consciousness.   Endo/Heme/Allergies: Negative for polydipsia. Does not bruise/bleed easily.   Psychiatric/Behavioral: The patient is not nervous/anxious and does not have insomnia.       Vitals:   Vitals reviewed height is 1.89 m (6' 2.4") and weight is 116.574 kg (257 lb). His temporal artery temperature is 97.9 F (36.6 C). His blood pressure is 150/77 and his pulse is 89. His respiration is 20 and oxygen saturation is 97%. Body mass index is 32.63 kg/(m^2).  Filed Vitals:    08/26/14 0700 08/26/14 0730 08/26/14 0800 08/26/14 0824   BP: 158/82 155/79 161/78 150/77   Pulse: 86 88 86 89   Temp:    97.9 F (36.6 C)   TempSrc:    Temporal Artery   Resp: 14 15 16 20    Height:    1.89 m (6' 2.4")   Weight:    116.574 kg (257 lb)   SpO2: 99% 100% 99% 97%     Intake and Output Summary (Last 24 hours) at Date Time No intake or output data in the 24 hours ending 08/26/14 8119   Physical Exam:   Physical Exam   Constitutional: He is oriented to person, place, and time and well-developed, well-nourished, and in no distress.   HENT:   Head: Normocephalic and atraumatic.   Eyes: Pupils are equal, round, and reactive to light. Scleral icterus is present.   Neck: Normal range of motion. Neck supple. No JVD present.   Cardiovascular: Normal rate, regular rhythm and intact distal pulses.    No murmur heard.  Pulmonary/Chest: Effort normal and breath sounds normal.  No respiratory distress. He has no wheezes. He has no rales.   Abdominal: Soft. Bowel sounds are normal. He exhibits distension. There is no tenderness. There is no rebound and no guarding.   Musculoskeletal: He exhibits no edema.   Neurological: He is alert and oriented to person, place, and time. He has normal strength, normal reflexes and intact cranial nerves. Gait normal.   Decreased sensation on  the right arm, right leg compared to the left side    Skin: Skin is warm and dry. No erythema.   Psychiatric: Memory, affect and judgment normal.          Clinical Information   Chief Complaint:  Chief Complaint   Patient presents with   . Numbness In Extremities     R side      Past Medical History:  Past Medical History   Diagnosis Date   . Diabetes mellitus       Past Surgical History:  Past Surgical History   Procedure Laterality Date   . Hemorrhoidectomy  2009?      Family History:History reviewed. No pertinent family history. Dad  MI  Brother stroke    Social History:  History   Alcohol Use   . 15.0 oz/week   . 25 Cans of beer per week     Comment: 5-6 beers a week, more on the weekend     History   Drug Use No     History   Smoking status   . Former Smoker   . Quit date: 08/25/1988   Smokeless tobacco   . Never Used     Social History     Social History   . Marital Status: Married     Spouse Name: N/A   . Number of Children: N/A   . Years of Education: N/A     Social History Main Topics   . Smoking status: Former Smoker     Quit date: 08/25/1988   . Smokeless tobacco: Never Used   . Alcohol Use: 15.0 oz/week     25 Cans of beer per week      Comment: 5-6 beers a week, more on the weekend   . Drug Use: No   . Sexual Activity: Not Asked     Other Topics Concern   . None     Social History Narrative   . None      Allergies:No Known Allergies   Medications:  Prescriptions prior to admission   Medication Sig Dispense Refill Last Dose   . esomeprazole (NEXIUM) 40 MG capsule Take 40 mg by mouth every morning before  breakfast.   08/25/2014 at pm   . metFORMIN (GLUCOPHAGE) 1000 MG tablet Take 1,000 mg by mouth 2 (two) times daily with meals.      Marland Kitchen UNKNOWN TO PATIENT              Results of Labs/imaging   Labs have been reviewed:   Coagulation Profile:   Recent Labs  Lab 08/26/14  0529   PT 15.6*   PT INR 1.2   PTT 31        CBC review:   Recent Labs  Lab 08/26/14  0529   WBC 5.50   HGB 15.8   HEMATOCRIT 45.6   PLATELETS 90*   MCV 99.8   RDW 14   NEUTROPHILS 59   LYMPHOCYTES AUTOMATED 24   EOSINOPHILS AUTOMATED 4   IMMATURE GRANULOCYTE 1   NEUTROPHILS ABSOLUTE 3.25   ABSOLUTE IMMATURE GRANULOCYTE 0.03      Chem Review:  Recent Labs  Lab 08/26/14  0529   SODIUM 139   POTASSIUM 4.1   CHLORIDE 108   CO2 22   BUN 9.8   CREATININE 0.8   GLUCOSE 294*   CALCIUM 9.9   MAGNESIUM 2.1   BILIRUBIN, TOTAL 2.2*   AST (SGOT) 59*   ALT 51  ALKALINE PHOSPHATASE 322*      Results     Procedure Component Value Units Date/Time    Troponin I [[id number]] Collected:  08/26/14 0529    Specimen Information:  Blood Updated:  08/26/14 0613     Troponin I <0.01 ng/mL     Comprehensive metabolic panel [[id number]]  (Abnormal) Collected:  08/26/14 0529    Specimen Information:  Blood Updated:  08/26/14 0611     Glucose 294 (H) mg/dL      BUN 9.8 mg/dL      Creatinine 0.8 mg/dL      Sodium 253 mEq/L      Potassium 4.1 mEq/L      Chloride 108 mEq/L      CO2 22 mEq/L      Calcium 9.9 mg/dL      Protein, Total 7.7 g/dL      Albumin 3.3 (L) g/dL      AST (SGOT) 59 (H) U/L      ALT 51 U/L      Alkaline Phosphatase 322 (H) U/L      Bilirubin, Total 2.2 (H) mg/dL      Globulin 4.4 (H) g/dL      Albumin/Globulin Ratio 0.8 (L)      Anion Gap 9.0     GFR [[id number]] Collected:  08/26/14 0529     EGFR >60.0 Updated:  08/26/14 0611    Magnesium [[id number]] Collected:  08/26/14 0529    Specimen Information:  Blood Updated:  08/26/14 0611     Magnesium 2.1 mg/dL     CBC with differential [[id number]]  (Abnormal) Collected:  08/26/14 0529    Specimen Information:  Blood from  Blood Updated:  08/26/14 0609     WBC 5.50 x10 3/uL      Hgb 15.8 g/dL      Hematocrit 84.1 %      Platelets 90 (L) x10 3/uL      RBC 4.57 (L) x10 6/uL      MCV 99.8 fL      MCH 34.6 (H) pg      MCHC 34.6 g/dL      RDW 14 %      MPV 11.5 fL      Neutrophils 59 %      Lymphocytes Automated 24 %      Monocytes 12 %      Eosinophils Automated 4 %      Basophils Automated 2 %      Immature Granulocyte 1 %      Neutrophils Absolute 3.25 x10 3/uL      Abs Lymph Automated 1.30 x10 3/uL      Abs Mono Automated 0.66 x10 3/uL      Abs Eos Automated 0.20 x10 3/uL      Absolute Baso Automated 0.09 x10 3/uL      Absolute Immature Granulocyte 0.03 x10 3/uL     Glucose Whole Blood - POCT [[id number]]  (Abnormal) Collected:  08/26/14 0529     POCT - Glucose Whole blood 268 (H) mg/dL Updated:  10/93/23 5573    Protime-INR [[id number]]  (Abnormal) Collected:  08/26/14 0529    Specimen Information:  Blood Updated:  08/26/14 0546     PT 15.6 (H) sec      PT INR 1.2      PT Anticoag. Given Within 48 hrs. None     APTT [[id number]] Collected:  08/26/14 0529     PTT 31 sec Updated:  08/26/14 0546  UA with reflex to micro (all hospital ED's and [facility], pts  3 + yrs) [[id number]]  (Abnormal) Collected:  08/26/14 0529    Specimen Information:  Urine Updated:  08/26/14 0538     Urine Type Clean Catch      Color, UA Yellow      Clarity, UA Clear      Specific Gravity UA 1.030      Urine pH 7.0      Leukocyte Esterase, UA Negative      Nitrite, UA Negative      Protein, UR Negative      Glucose, UA >=500 (A)      Ketones UA Negative      Urobilinogen, UA 4.0 (A) mg/dL      Bilirubin, UA Negative      Blood, UA Negative          Radiology reports have been reviewed:  Radiology Results (24 Hour)     Procedure Component Value Units Date/Time    Chest AP Portable [[id number]] Collected:  08/26/14 3244    Order Status:  Completed Updated:  08/26/14 0619    Narrative:      INDICATION: stroke evaluation.  54 year old male with history  of  diabetes. Suspected stroke.             COMPARISON: No prior chest x-ray with which to compare.          TECHNIQUE: XR CHEST AP PORTABLE: AP portable upright    FINDINGS:     No indwelling lines are appreciated. The patient is in  lordotic position.     The lungs are grossly clear. There is no  silhouetting of the diaphragms or cardio-mediastinal contours.  There is  no evidence of pulmonary edema.      There is no pneumothorax, pleural  effusion or other pleural pathology.      The heart is likely normal in  size when allowances are made for technical factors.      There is no  mediastinal shift and the upper mediastinum does not appear widened.       Hilar structures are within normal limits.       There is no apparent  subdiaphragmatic free air.          No significant bony lesions are  seen.               Impression:       Portable AP radiographic examination of the chest shows no  acute abnormality.                     Miguel Dibble, MD   08/26/2014 6:15 AM      CT Head WO Contrast [[id number]] Collected:  08/26/14 0541    Order Status:  Completed Updated:  08/26/14 0550    Narrative:      INDICATION: right side numbness.  54 year old male presents with  right-sided numbness and weakness. History of diabetes.                COMPARISON: No prior study for comparison.           TECHNIQUE:  CT HEAD WO CONTRAST: Axial imaging of the brain was  performed from base to vertex.  Scans were performed without IV  contrast.     The following dose reduction techniques were utilized:  automated exposure control and/or adjustment of the mA and/or kV  according to patient's size, and the use  of iterative reconstruction  technique.    FINDINGS:      The scalp and calvarium appear normal without acute or  significant abnormalities.     No extra-axial lesions are identified.  There is apparent low density in the left cerebellar hemisphere.  Although this may represent artifact, the possibility of an old  cerebellar infarct might  be considered. There is no mass effect in the  posterior fossa.     There is no midline shift or other significant mass  effect.  No intracranial hemorrhage is seen.  There is no hydrocephalus.   No acute territorial infarcts are identified.  The basal cisterns are  clear. Nonspecific lucency is seen in the periventricular white matter  which is likely the result of microischemia.  Atherosclerotic  calcifications are noted in the cavernous carotid arteries. The skull  base, middle ear structures, and mastoid air cells are grossly  unremarkable.      The visualized facial structures are essentially  unremarkable and show no acute abnormality. A retention cyst is noted in  the left maxillary sinus.                      Impression:       CT scan of the head shows no hydrocephalus, herniation, or  intracranial hemorrhage. There is no visible acute intracranial  abnormality.       An acute stroke, if present, is not visible on this  examination.  Correlate clinically to determine whether further imaging  with MRI is indicated.  There are questionable findings in the posterior  fossa which suggests an old left cerebellar hemispheric infarct.  Other  findings as noted above.      Findings were discussed with Dr. Claiborne Rigg  at the following time: 08/26/2014 5:44 AM.                          Miguel Dibble, MD   08/26/2014 5:46 AM           EKG: EKG reviewed  NSR no acute ST T wave changes           Hospitalist   Signed by: Keyonna Comunale V   08/26/2014 9:27 AM

## 2014-08-26 NOTE — Plan of Care (Signed)
Problem: Safety  Goal: Patient will be free from injury during hospitalization  Outcome: Progressing  Able to use the call light for assistance when needed    Problem: Pain  Goal: Patient's pain/discomfort is manageable  Outcome: Progressing  Denies pain    Problem: Day of Admission- Stroke  Goal: Stable vital signs and fluid balance  Outcome: Progressing  Goal: Patient will maintain Adequate Oxygenation  Outcome: Progressing  Right arm and right leg less sensation  Goal: Mobility/activity is maintained at optimum level for patient  Ambulatory

## 2014-08-26 NOTE — SLP Eval Note (Signed)
[facility]  [address]  [place], [place]. [address]    Department of Rehabilitation Services  [phone]    Speech and Language Therapy Bedside Swallow Evaluation    Patient: Jonathan Herrera    MRN#: [id number]     Time of Treatment: SLP Received On: 08/26/14  Start Time: 1014  Stop Time: 1039  Time Calculation (min): 25 min         Consult received for Jonathan Herrera for SLP Bedside Swallow Evaluation and Treatment.      Assessment:   Pt demonstrated functional oropharyngeal swallow for regular solids and thin liquids, slightly delayed oral transits requiring the use of safe swallow precautions to best mitigate aspiration. Pt able to demo and verbalize strategies at end of session. Speech and cognition intact.  No further SLP services warranted.     Plan/ Recommendations:    1. Advance diet to regular solids and thin liquids     2. D/C SLP services        Diet Solids Recommendation: regular  Diet Liquids Recommendations: thin consistency  Precautions/Compensations: Awake/alert, Upright 90 degrees for all oral intake, 45 degrees upright after meals, Alternate solids and liquids, Swallow multiple times per bite/sip, Small bites/sips, Eat/feed slowly  Recommendation Discussed With: : Patient, Nurse  Recommended Form of Meds: whole, with liquid      Goals:        Discharge Recommendations:   Recommendations: No follow up required      Medical Diagnosis: Thrombocytopenia [D69.6]  Hyperglycemia [R73.9]  Elevated BP [I10]  Elevated alkaline phosphatase level [R74.8]  Elevated bilirubin [R17]  Numbness and tingling of right arm and leg [R20.2]  Elevated AST (SGOT) [R74.0]    History of Present Illness: Jonathan Herrera is a 54 y.o. male admitted on 08/26/2014 with right arm and leg altered sens (cant feel hot or cold)  for approx 17 hours (noon yest) with no similar but milder right leg sxs in past with no eval- hx of dm- no other sxs    CXR: no acute findings    Patient Active Problem List    Diagnosis   . TIA (transient ischemic attack)   . Paresthesia of right upper and lower extremity   . Type 2 diabetes mellitus   . Abnormal LFTs   . Essential hypertension   . Alcohol abuse   . Thrombocytopenia        Past Medical/Surgical History:  Past Medical History   Diagnosis Date   . Diabetes mellitus       Past Surgical History   Procedure Laterality Date   . Hemorrhoidectomy  2009?         History/Current Status:  History/Current Status  Respiratory Status: within normal limits  Behavior/Mental Status: Awake/alert, Able to follow directions  Nutrition: NPO      Subjective:   Nursing clears patient for therapy. Patient's medical condition is appropriate for Speech therapy intervention at this time.    Objective:   Observation of Patient/Vital Signs:  Patient is in bed with no medical equipment in place.    Oral Motor Skills  Oral Motor Skills: within functional limits    Deglutition Skills  Position: upright 90 degrees  Food(s) Tested: thin liquid, solid  Oral Stage: AP propulsion reduced  Pharyngeal Stage: multiple swallows per bolus with soft and hard solids requiring SLP verbal cues to initiate liquid wash to clear suspected pharyngeal retention. Hyolaryngeal excursion complete per palpation with no additional s/s aspiration observed. Pt  consumed 1 [place] sandwich,  Fruit cup, and 1/2 cup thin liquids via straw and cup.    Treatment Activities and Patient/Family Education: Aspiration precautions.    Functional Limitation Reporting    NOMS Scoring:  NOMS-FCM  Swallowing: Level 7            Primary Functional Limitation:      Swallowing G Code Set  Swallowing, Current Status (W1027): CH  Swallowing, Goal Status (O5366): CH  Swallowing, D/C Status (Y4034): CH      Based on NOMS score, functional assessment, and clinical judgement.      Attention MDs:  Thank you for allowing [place] to participate in care of Jonathan Herrera. Regulations from the [employer] for Medicare and Medicaid Services (CMS) require your  review and approval of this plan of care.    Please cosign this note indicating you are in agreement with the SLP Plan of Care so we may initiate the therapy treatment plan.     Hillery Jacks, M.S. CCC-SLP

## 2014-08-27 DIAGNOSIS — I63342 Cerebral infarction due to thrombosis of left cerebellar artery: Secondary | ICD-10-CM | POA: Insufficient documentation

## 2014-08-27 DIAGNOSIS — E119 Type 2 diabetes mellitus without complications: Secondary | ICD-10-CM | POA: Insufficient documentation

## 2014-08-27 LAB — COMPREHENSIVE METABOLIC PANEL
ALT: 46 U/L (ref 0–55)
AST (SGOT): 62 U/L — ABNORMAL HIGH (ref 5–34)
Albumin/Globulin Ratio: 0.7 — ABNORMAL LOW (ref 0.9–2.2)
Albumin: 3 g/dL — ABNORMAL LOW (ref 3.5–5.0)
Alkaline Phosphatase: 212 U/L — ABNORMAL HIGH (ref 38–106)
Anion Gap: 7 (ref 5.0–15.0)
BUN: 9.5 mg/dL (ref 9.0–28.0)
Bilirubin, Total: 2.5 mg/dL — ABNORMAL HIGH (ref 0.2–1.2)
CO2: 24 mEq/L (ref 22–29)
Calcium: 9.4 mg/dL (ref 8.5–10.5)
Chloride: 107 mEq/L (ref 100–111)
Creatinine: 0.7 mg/dL (ref 0.7–1.3)
Globulin: 4.1 g/dL — ABNORMAL HIGH (ref 2.0–3.6)
Glucose: 164 mg/dL — ABNORMAL HIGH (ref 70–100)
Potassium: 4.3 mEq/L (ref 3.5–5.1)
Protein, Total: 7.1 g/dL (ref 6.0–8.3)
Sodium: 138 mEq/L (ref 136–145)

## 2014-08-27 LAB — CBC
Hematocrit: 45.7 % (ref 42.0–52.0)
Hgb: 15.6 g/dL (ref 13.0–17.0)
MCH: 33.9 pg — ABNORMAL HIGH (ref 28.0–32.0)
MCHC: 34.1 g/dL (ref 32.0–36.0)
MCV: 99.3 fL (ref 80.0–100.0)
MPV: 11.6 fL (ref 9.4–12.3)
Platelets: 87 10*3/uL — ABNORMAL LOW (ref 140–400)
RBC: 4.6 10*6/uL — ABNORMAL LOW (ref 4.70–6.00)
RDW: 13 % (ref 12–15)
WBC: 4.6 10*3/uL (ref 3.50–10.80)

## 2014-08-27 LAB — GFR: EGFR: >60

## 2014-08-27 LAB — GLUCOSE WHOLE BLOOD - POCT
Whole Blood Glucose POCT: 154 mg/dL — ABNORMAL HIGH (ref 70–100)
Whole Blood Glucose POCT: 281 mg/dL — ABNORMAL HIGH (ref 70–100)

## 2014-08-27 LAB — TROPONIN I: Troponin I: <0.01 ng/mL (ref 0.00–0.09)

## 2014-08-27 LAB — HEPATITIS B CORE ANTIBODY, TOTAL: Hepatitis B Core Total AB: NONREACTIVE

## 2014-08-27 MED ORDER — THIAMINE HCL 100 MG PO TABS
100.0000 mg | ORAL_TABLET | Freq: Every day | ORAL | Status: AC
Start: 2014-08-27 — End: ?

## 2014-08-27 MED ORDER — RAMIPRIL 5 MG PO CAPS
5.0000 mg | ORAL_CAPSULE | Freq: Every day | ORAL | Status: DC
Start: 2014-08-27 — End: 2014-09-05

## 2014-08-27 MED ORDER — ATORVASTATIN CALCIUM 40 MG PO TABS
40.0000 mg | ORAL_TABLET | Freq: Every evening | ORAL | Status: AC
Start: 2014-08-27 — End: ?

## 2014-08-27 MED ORDER — ATORVASTATIN CALCIUM 20 MG PO TABS
20.0000 mg | ORAL_TABLET | Freq: Every evening | ORAL | Status: DC
Start: 2014-08-27 — End: 2014-08-27

## 2014-08-27 MED ORDER — GLIMEPIRIDE 1 MG PO TABS
1.0000 mg | ORAL_TABLET | Freq: Every morning | ORAL | Status: DC
Start: 2014-08-27 — End: 2014-08-27

## 2014-08-27 MED ORDER — ASPIRIN 325 MG PO TBEC
325.0000 mg | DELAYED_RELEASE_TABLET | Freq: Every day | ORAL | Status: AC
Start: 2014-08-27 — End: ?

## 2014-08-27 MED ORDER — ATORVASTATIN CALCIUM 20 MG PO TABS
40.0000 mg | ORAL_TABLET | Freq: Every evening | ORAL | Status: DC
Start: 2014-08-27 — End: 2014-08-27

## 2014-08-27 MED ORDER — FOLIC ACID 1 MG PO TABS
1.0000 mg | ORAL_TABLET | Freq: Every day | ORAL | Status: AC
Start: 2014-08-27 — End: ?

## 2014-08-27 NOTE — Plan of Care (Signed)
Pt is being discharged to hotel via cab with voucher. Pt given discharge instructions and medications.

## 2014-08-27 NOTE — Consults (Addendum)
[facility] C[facility] CONSULTATION REPORT  [facility]    Date Time: 08/27/2014 10:13 AM  Patient Name: Jonathan Herrera  Requesting Physician: Edwina Barth       Reason for Consultation:   Stroke      History:   Jonathan Herrera is a 54 y.o. male admitted on 08/26/2014.  We have been asked by Edwina Barth,*,  to provide cardiac consultation, regarding right sided, altered temperature sensation due to an acute lateral medulla infarct.  The MRI also showed evidence of a chronic left inferomedial cerebellar CVA.  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  Routine workup included an echo which showed normal left ventricular function, EF 65% with a small PFO with right to left shunt by agitated saline.  The patient has not had any palpitations, chest discomfort or syncope.  He has chronic exertional dyspnea with 2 flights of stairs and occasional positional lightheadedness.  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  He is followed there by a doctor for DM.  He also drinks alcohol regularly and has cirrhosis on ultrasound, elevated PT and low platelets.    Past Medical History:     Past Medical History   Diagnosis Date   . Diabetes mellitus        Past Surgical History:     Past Surgical History   Procedure Laterality Date   . Hemorrhoidectomy  2009?       Family History:   History reviewed. No pertinent family history.    Social History:     Social History     Social History   . Marital Status: Married     Spouse Name: N/A   . Number of Children: N/A   . Years of Education: N/A     Social History Main Topics   . Smoking status: Former Smoker     Quit date: 08/25/1988   . Smokeless tobacco: Never Used   . Alcohol Use: 15.0 oz/week     25 Cans of beer per week      Comment: 5-6 beers a week, more on the weekend   . Drug Use: No   . Sexual Activity: Not on file     Other Topics Concern    . Not on file     Social History Narrative   . No narrative on file       Allergies:   No Known Allergies    Medications:     Prescriptions prior to admission   Medication Sig   . esomeprazole (NEXIUM) 40 MG capsule Take 40 mg by mouth every morning before breakfast.   . metFORMIN (GLUCOPHAGE) 1000 MG tablet Take 1,000 mg by mouth 2 (two) times daily with meals.   Marland Kitchen UNKNOWN TO PATIENT        Current Facility-Administered Medications   Medication Dose Route Frequency Provider Last Rate Last Dose   . aspirin EC EC tablet 325 mg  325 mg Oral Daily Edwina Barth, MD   325 mg at 08/27/14 0951   . dextrose 50 % bolus 25 mL  25 mL Intravenous PRN Vallabhaneni, Madhuri V, MD       . folic acid (FOLVITE) tablet 1 mg  1 mg Oral Daily Vallabhaneni, Madhuri V, MD   1 mg at 08/27/14 0951   . glucagon (rDNA) (GLUCAGEN) injection 1 mg  1 mg Intramuscular PRN Carlyon Shadow, Madhuri  V, MD       . insulin aspart (NovoLOG) injection 1-5 Units  1-5 Units Subcutaneous TID AC PRN Edwina Barth, MD   2 Units at 08/26/14 1837   . labetalol (NORMODYNE,TRANDATE) injection 10 mg  10 mg Intravenous Q15 Min PRN Vallabhaneni, Madhuri V, MD       . LORazepam (ATIVAN) injection 2-4 mg  2-4 mg Intravenous PRN Vallabhaneni, Madhuri V, MD       . metFORMIN (GLUCOPHAGE) tablet 1,000 mg  1,000 mg Oral BID Meals Edwina Barth, MD   1,000 mg at 08/27/14 0841   . ondansetron (ZOFRAN-ODT) disintegrating tablet 4 mg  4 mg Oral Q8H PRN Vallabhaneni, Madhuri V, MD        Or   . ondansetron (ZOFRAN) injection 4 mg  4 mg Intravenous Q8H PRN Vallabhaneni, Madhuri V, MD       . pantoprazole (PROTONIX) EC tablet 40 mg  40 mg Oral QAM AC Vallabhaneni, Madhuri V, MD   40 mg at 08/27/14 0841   . thiamine (B-1) tablet 100 mg  100 mg Oral Daily Edwina Barth, MD   100 mg at 08/27/14 6045         Review of Systems:    Comprehensive review of systems including constitutional, eyes, ears, nose, mouth, throat, cardiovascular, GI,  GU, musculoskeletal, integumentary, respiratory, neurologic, psychiatric, and endocrine is negative other than what is mentioned already in the history of present illness    Physical Exam:     Filed Vitals:    08/27/14 1000   BP: 134/74   Pulse: 94   Temp: 97.6 F (36.4 C)   Resp: 18   SpO2: 97%     Temp (24hrs), Avg:97.6 F (36.4 C), Min:97.3 F (36.3 C), Max:98.3 F (36.8 C)      Intake and Output Summary (Last 24 hours) at Date Time    Intake/Output Summary (Last 24 hours) at 08/27/14 1013  Last data filed at 08/26/14 1030   Gross per 24 hour   Intake    500 ml   Output      0 ml   Net    500 ml       GENERAL: Patient is in no acute distress   HEENT: No scleral icterus or conjunctival pallor, normocephalic   NECK: No jugular venous distention or thyromegaly, normal carotid upstrokes without bruits   CARDIAC: Normal apical impulse, regular rate and rhythm, with normal S1 and S2, and no murmurs, rubs, or gallops   CHEST: Clear to auscultation bilaterally, normal respiratory effort  ABDOMEN: No abdominal bruits, obese, nontender, non-distended, good bowel sounds   EXTREMITIES: No clubbing, cyanosis, or edema, 2+ DPand radial pulses bilaterally  SKIN: No rash or jaundice   NEUROLOGIC: Alert and oriented to time, place and person, normal mood and affect    MUSCULOSKELETAL: Normal muscle strength and tone.      Labs Reviewed:       Recent Labs  Lab 08/27/14  0533 08/26/14  2012 08/26/14  1056   TROPONIN I <0.01 <0.01 <0.01           Recent Labs  Lab 08/26/14  1056   CHOLESTEROL 234*   TRIGLYCERIDES 226*   HDL 53   LDL CALCULATED 136*       Recent Labs  Lab 08/27/14  0533   BILIRUBIN, TOTAL 2.5*   PROTEIN, TOTAL 7.1   ALBUMIN 3.0*   ALT 46   AST (SGOT) 62*  Recent Labs  Lab 08/26/14  1056   MAGNESIUM 1.9       Recent Labs  Lab 08/26/14  0529   PT 15.6*   PT INR 1.2   PTT 31       Recent Labs  Lab 08/27/14  0533 08/26/14  0529   WBC 4.60 5.50   HGB 15.6 15.8   HEMATOCRIT 45.7 45.6   PLATELETS 87* 90*        Recent Labs  Lab 08/27/14  0533 08/26/14  1056 08/26/14  0529   SODIUM 138 138 139   POTASSIUM 4.3 3.9 4.1   CHLORIDE 107 108 108   CO2 24 23 22    BUN 9.5 8.9* 9.8   CREATININE 0.7 0.8 0.8   EGFR >60.0 >60.0 >60.0   GLUCOSE 164* 231* 294*   CALCIUM 9.4 9.1 9.9         Radiology   Radiological Procedure reviewed.      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 Since the patient was not on antiplatelet therapy before this event, there is no data to support further anticoagulation purely from a cardiac standpoint and risks of bleeding may outweigh benefits   Discussed abnormal echo with patient and recommended follow up with PCP in [place].  TEE is reasonable for further workup but would not change current management and can be done electively   Alcohol cessation            Signed by: Encarnacion Slates, MD      [facility]  NP Spectralink [phone] (8am-5pm)  MD Spectralink [phone] (8am-5pm)  After hours, non urgent consult line [phone]  After Hours, urgent consults [phone]

## 2014-08-27 NOTE — Plan of Care (Signed)
Problem: Health Promotion  Goal: Knowledge - disease process  Extent of understanding conveyed about a specific disease process.   Intervention: Risk identification  Pt spoke to Dr.Vallabellini regarding stroke results. Plan is for pt to be evaluated by neurology and determine if he will be discharged or sent upstairs      Problem: Safety  Goal: Patient will be free from injury during hospitalization  Intervention: Assess for patient's risk for elopement and implement Elopement Risk plan per policy  Pt is compliant and is not an elopement risk      Problem: Pain  Goal: Patient's pain/discomfort is manageable  Outcome: Progressing  Pt denies pain at the time of this assessment

## 2014-08-27 NOTE — Discharge Summary (Signed)
[facility] HOSPITALIST   [place] Summary     Patient Info:   Date Time: 08/27/2014  3:54 PM   Patient Name:Jonathan Herrera   AVW:[id number]    PCP: Christa See, MD   Admit Date:08/26/2014   Attending Physician:Yareth Macdonnell Precious Haws Course:   Please see H&P for complete details of HPI and ROS. The patient was admitted to [facility] and has been diagnosed with the following conditions and has been taken care as mentioned below.    Patient is a 54 year old male being admitted to the hospital for right-sided paresthesia     --- Acute CVA with right-sided paresthesia. Patient MRI of the brain showed acute infarct in the lateral medullary on the left side. 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 Case has been discussed with Dr. Alfredo Martinez, the neurologist and also with Dr. Sherril Croon the neurosurgeon. Patient is not a candidate for any surgical intervention now.  Patient is high risk for bleeding with anticoagulation due to low platelets and underlying liver disease with active alcohol abuse..  Discussed at length with the patient about the options and patient has agreed to go on aspirin .Dr. Alfredo Martinez has recommended  aspirin 325 mg for now. Dr. Sherril Croon has recommended that the patient should see Neuro interventional radiologist as outpatient in 2 weeks for follow-up .     --- PFO found on echocardiogram.  Appreciate Dr. Michelle Piper  input.  Continue aspirin for now.  We will need to TEE as outpatient.    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 Hemoglobin A1c 7.2 .  To be followed up as outpatient.    --- Uncontrolled hypertension.  Patient stated that he has been on Altace in the past, which has been discontinued.  We will restart Altace at 5 mg daily.    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  Compensated. Patient has significant history of alcohol. He also has evidence of elevated PT and low platelets.  Right upper quadrant ultrasound shows possible cirrhosis.  Hepatitis panel negative.  Will need outpatient follow-up with gastroenterologist and may need EGD and liver biopsy.  Continue Nexium    --- Alcohol abuse. Counseled him to stop alcohol.Patient is willing to quit alochol Counselled extensively.     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 Monitor on ASA.     --- Hyperlipidemia.  Add Lipitor 40 mg daily    Discussed with the patient's wife at length on the phone about his stroke and underlying liver problem.  Explained that he is at increased risk of stroke in the future and will need good risk factor modification and also will need to stop alcohol.  Explained to her about the follow-up appointments .  Also explained to her that he will need supervision and the wife said she will make arrangements to come and stay with him.    Case manager has been consulted to help with medications and discharge clinic appointment.  Patient has been advised to have a follow-up next week at the discharge clinic and not to work until he has a follow-up and stable.       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  No chest pain, no shortness of breath.  Continues to have decreased sensation on the right side.  No blurry vision, no double vision.  No focal arm or leg weakness.  Ambulating with no issues.  No dizziness.     Chief Complaint:   Chief Complaint   Patient presents with   . Numbness In Extremities     R side      Vitals: Vitals reviewed height is 1.89 m (6' 2.4") and weight is 116.574 kg (257 lb). His temporal artery temperature is 97.8 F (36.6 C). His blood pressure is 126/71 and his pulse is 95. His respiration is 18 and oxygen saturation is 96%. Body mass index is 32.63 kg/(m^2).  Filed Vitals:    08/27/14 0200 08/27/14 0600 08/27/14 1000 08/27/14 1400   BP: 150/81 127/77 134/74 126/71   Pulse: 88 87 94 95   Temp: 97.3 F (36.3 C) 97.8 F (36.6 C) 97.6 F (36.4 C) 97.8 F (36.6 C)   TempSrc: Temporal Artery Temporal Artery Temporal Artery Temporal Artery   Resp: 16 16 18 18    Height:       Weight:       SpO2: 96% 95% 97% 96%     Intake and Output Summary (Last 24 hours) at Date Time No intake or output data in the 24 hours ending 08/27/14 1554   Physical Exam:   Comfortable, not in acute respiratory distress   HEENT pupils equal, reactive to light   Chest bilaterally clear breath sounds   CVS S1, S2 present, regular   Abd soft , Nontender, obese  Ext edema   Neuro  AAO x 3 .  Cranial nerves  2-12 intact.  Motor 5 x 5 bilaterally.  Sensation decreased to light touch and temperature on the right side compared to the left side.  Gait normal         Discharge Diagnosis and Instructions:   Pending Labs:        Unresulted Labs     Procedure . . . Date/Time    Hepatitis B core antibody, total [[id number]] Collected:  08/26/14 1056    Specimen Information:  Blood Updated:  08/26/14 1102    Narrative:      Starting For 1 Occurrences         Consultants:Plan IP CONSULT TO NEUROLOGY  IP CONSULT TO CARDIOLOGY  [facility] FACE-TO-FACE (FTF) ENCOUNTER   Discharge Medications:         Discharge  Medication List      Taking          aspirin EC 325 MG EC tablet   Dose:  325 mg   Take 1 tablet (325 mg total) by mouth daily.       atorvastatin 40 MG tablet  Dose:  40 mg   Commonly known as:  LIPITOR   Take 1 tablet (40 mg total) by mouth nightly.       esomeprazole 40 MG capsule   Dose:  40 mg   Commonly known as:  NexIUM   Take 40 mg by mouth every morning before breakfast.       folic acid 1 MG tablet   Dose:  1 mg   Commonly known as:  FOLVITE   Take 1 tablet (1 mg total) by mouth daily.       glipiZIDE 5 MG tablet   Dose:  5 mg   Commonly known as:  GLUCOTROL   Take 5 mg by mouth 2 (two) times daily before meals.       metFORMIN 1000 MG tablet   Dose:  1000 mg   Commonly known as:  GLUCOPHAGE   Take 1,000 mg by mouth 2 (two) times daily with meals.       ramipril 5 MG capsule   Dose:  5 mg   Commonly known as:  ALTACE   Take 1 capsule (5 mg total) by mouth daily.       thiamine 100 MG tablet   Dose:  100 mg   Commonly known as:  B-1   Take 1 tablet (100 mg total) by mouth daily.            Labs/Images to be followed at your PCP office: Blood pressure.  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 TIA (transient ischemic attack)  . Paresthesia of right upper and lower extremity  . Type 2 diabetes mellitus  . Abnormal LFTs  . Essential hypertension  . Alcohol abuse  . CVA (cerebral vascular accident)   Follow up:   Follow-up Information     Follow up with Pcp, Noneorunknown, MD In 1 week.         Follow up with Bruna Potter, MD In 1 week.    Specialty:  Neurology    Contact information:    [address]  [address]  [place] [address]  [phone]          Follow up with Grover Canavan, MD PHD In 2 weeks.    Specialty:  Neurological Surgery    Contact information:    [address]  [address]  [place] [address]  [phone]                Results of Labs/imaging:   Labs have been reviewed:   Coagulation Profile:     Recent Labs  Lab 08/26/14  0529   PT 15.6*   PT INR 1.2   PTT 31        CBC review:     Recent Labs  Lab 08/27/14  0533 08/26/14  0529   WBC 4.60 5.50   HGB 15.6 15.8   HEMATOCRIT 45.7 45.6  PLATELETS 87* 90*   MCV 99.3 99.8   RDW 13 14   NEUTROPHILS  --  59   LYMPHOCYTES AUTOMATED  --  24   EOSINOPHILS AUTOMATED  --  4   IMMATURE GRANULOCYTE  --  1   NEUTROPHILS ABSOLUTE  --  3.25   ABSOLUTE IMMATURE GRANULOCYTE  --  0.03      Chem Review:    Recent Labs  Lab 08/27/14  0533 08/26/14  1056 08/26/14  0529   SODIUM 138 138 139   POTASSIUM 4.3 3.9 4.1   CHLORIDE 107 108 108   CO2 24 23 22    BUN 9.5 8.9* 9.8   CREATININE 0.7 0.8 0.8   GLUCOSE 164* 231* 294*   CALCIUM 9.4 9.1 9.9   MAGNESIUM  --  1.9 2.1   BILIRUBIN, TOTAL 2.5* 2.0* 2.2*   AST (SGOT) 62* 52* 59*   ALT 46 43 51   ALKALINE PHOSPHATASE 212* 278* 322*      Results     Procedure Component Value Units Date/Time    Glucose Whole Blood - POCT [[id number]]  (Abnormal) Collected:  08/27/14 1106     POCT - Glucose Whole blood 281 (H) mg/dL Updated:  40/98/11 9147    Glucose Whole Blood - POCT [[id number]]  (Abnormal) Collected:  08/27/14 0705     POCT - Glucose Whole blood 154 (H) mg/dL Updated:  86/57/84 6962    Troponin I [[id number]] Collected:  08/27/14 0533    Specimen Information:  Blood Updated:  08/27/14 4010     Troponin I <0.01 ng/mL     Narrative:      Send first STAT if last troponin was done greater than 4  hours ago.    Comprehensive metabolic panel [[id number]]  (Abnormal) Collected:  08/27/14 0533    Specimen Information:  Blood  Updated:  08/27/14 0636     Glucose 164 (H) mg/dL      BUN 9.5 mg/dL      Creatinine 0.7 mg/dL      Sodium 034 mEq/L      Potassium 4.3 mEq/L      Chloride 107 mEq/L      CO2 24 mEq/L      Calcium 9.4 mg/dL      Protein, Total 7.1 g/dL      Albumin 3.0 (L) g/dL      AST (SGOT) 62 (H) U/L      ALT 46 U/L      Alkaline Phosphatase 212 (H) U/L      Bilirubin, Total 2.5 (H) mg/dL      Globulin 4.1 (H) g/dL      Albumin/Globulin Ratio 0.7 (L)      Anion Gap 7.0     Narrative:      QAMLAB x 3    GFR [[id number]] Collected:  08/27/14 0533     EGFR >60.0 Updated:  08/27/14 0636    Narrative:      QAMLAB x 3    CBC without differential [[id number]]  (Abnormal) Collected:  08/27/14 0533    Specimen Information:  Blood from Blood Updated:  08/27/14 0613     WBC 4.60 x10 3/uL      Hgb 15.6 g/dL      Hematocrit 18.8 %      Platelets 87 (L) x10 3/uL      RBC 4.60 (L) x10 6/uL      MCV 99.3 fL      MCH 33.9 (H) pg  MCHC 34.1 g/dL      RDW 13 %      MPV 11.6 fL     Narrative:      QAMLAB x 3    Glucose Whole Blood - POCT [[id number]]  (Abnormal) Collected:  08/26/14 2104     POCT - Glucose Whole blood 224 (H) mg/dL Updated:  40/98/11 9147    Troponin I [[id number]] Collected:  08/26/14 2012    Specimen Information:  Blood Updated:  08/26/14 2049     Troponin I <0.01 ng/mL     Narrative:      Send first STAT if last troponin was done greater than 4  hours ago.    Hemoglobin A1c [[id number]]  (Abnormal) Collected:  08/26/14 1056    Specimen Information:  Blood Updated:  08/26/14 1735     Hemoglobin A1C 7.2 (H) %     Narrative:      Starting For 1 Occurrences    Glucose Whole Blood - POCT [[id number]]  (Abnormal) Collected:  08/26/14 1655     POCT - Glucose Whole blood 240 (H) mg/dL Updated:  44/01/02 7253    Hepatitis C (HCV) antibody, Total [[id number]] Collected:  08/26/14 1056    Specimen Information:  Blood Updated:  08/26/14 1641     Hepatitis C, AB Non-Reactive     Narrative:      Starting For 1 Occurrences    Hepatitis B (HBV)  Surface Antigen [[id number]] Collected:  08/26/14 1056    Specimen Information:  Blood Updated:  08/26/14 1641     Hepatitis B Surface AG Non-Reactive     Narrative:      Starting For 1 Occurrences    Lipid panel (Fasting) [[id number]]  (Abnormal) Collected:  08/26/14 1056    Specimen Information:  Blood Updated:  08/26/14 1630     Cholesterol 234 (H) mg/dL      Triglycerides 841 (H) mg/dL      HDL 53 mg/dL      LDL Calculated 660 (H) mg/dL      VLDL Cholesterol Cal 45 (H) mg/dL      CHOL/HDL Ratio 4.4     Narrative:      Starting For 1 Occurrences    Hemolysis index [[id number]] Collected:  08/26/14 1056     Hemolysis Index 7 Updated:  08/26/14 1630    Narrative:      Starting For 1 Occurrences         Radiology reports have been reviewed:  Radiology Results (24 Hour)     Procedure Component Value Units Date/Time    US Venous Duplex Doppler Leg Bilateral [[id number]] Collected:  08/26/14 2203    Order Status:  Completed Updated:  08/26/14 2210    Narrative:      History: Bilateral lower extremity paresthesias. CVA.    COMPARISON: None     Duplex  evaluation of the deep venous system of both lower extremities  was performed for evaluation of DVT.   On each side the common femoral  vein, femoral vein, and popliteal vein were visualized and appear  unremarkable.  These veins are compressible with no evidence of  thrombus.  Doppler demonstrates patency and normal directional and  phasic flow.  Appropriate augmentation was observed.  The proximal deep  femoral vein is visualized and appears unremarkable.  The portions of  the posterior tibial and peroneal veins that were seen appear  unremarkable with no thrombus noted.  There is no indirect evidence of a  calf vein  thrombus.      Impression:       No evidence of deep vein thrombosis involving either lower  extremity.        Geanie Cooley, MD   08/26/2014 10:06 PM      US Abdomen Limited Ruq [[id number]] Collected:  08/26/14 1736    Order Status:  Completed Updated:   08/26/14 1742    Narrative:      HISTORY: Abnormal LFTs.    COMPARISON: None.    RIGHT UPPER QUADRANT ABDOMINAL ULTRASOUND:  The liver is normal in size  with mild increased echogenicity, heterogeneous echotexture and subtle  surface nodularity, suggesting underlying cirrhosis. No focal mass is  seen in the liver given decreased sensitivity with diffuse parenchymal  abnormality. The intrahepatic biliary ducts are nondilated.  The  gallbladder is visualized and contains no sludge or stones. No  gallbladder wall thickening or pericholecystic fluid is seen to suggest  cholecystitis.   The common bile duct is nondilated measuring 3 mm.  The  visualized portions of the pancreas were unremarkable.     The visualized portions of the abdominal aorta were non aneurysmal.   The visualized portions of the IVC are unremarkable. The right kidney is  top normal in size with pelvic sinus lipomatosis, and possible  duplicated collecting systems measuring 14.2 cm in length. There is no  hydronephrosis.      Impression:      . Diffuse hepatic parenchymal abnormality suggesting  cirrhosis.    Geanie Cooley, MD   08/26/2014 5:38 PM          Ct Head Wo Contrast    08/26/2014   INDICATION: right side numbness.  54 year old male presents with right-sided numbness and weakness. History of diabetes.              COMPARISON: No prior study for comparison.         TECHNIQUE:  CT HEAD WO CONTRAST: Axial imaging of the brain was performed from base to vertex.  Scans were performed without IV contrast.     The following dose reduction techniques were utilized: automated exposure control and/or adjustment of the mA and/or kV according to patient's size, and the use of iterative reconstruction technique.  FINDINGS:      The scalp and calvarium appear normal without acute or significant abnormalities.     No extra-axial lesions are identified. There is apparent low density in the left cerebellar hemisphere. Although this may represent artifact,  the possibility of an old cerebellar infarct might be considered. There is no mass effect in the posterior fossa.     There is no midline shift or other significant mass effect.  No intracranial hemorrhage is seen.  There is no hydrocephalus.  No acute territorial infarcts are identified.  The basal cisterns are clear. Nonspecific lucency is seen in the periventricular white matter which is likely the result of microischemia.  Atherosclerotic calcifications are noted in the cavernous carotid arteries. The skull base, middle ear structures, and mastoid air cells are grossly unremarkable.      The visualized facial structures are essentially unremarkable and show no acute abnormality. A retention cyst is noted in the left maxillary sinus.                     08/26/2014    CT scan of the head shows no hydrocephalus, herniation, or intracranial hemorrhage. There is no visible acute intracranial abnormality.  An acute stroke, if present, is not visible on this examination.  Correlate clinically to determine whether further imaging with MRI is indicated.  There are questionable findings in the posterior fossa which suggests an old left cerebellar hemispheric infarct.  Other findings as noted above.      Findings were discussed with Dr. Claiborne Rigg at the following time: 08/26/2014 5:44 AM.                        Miguel Dibble, MD  08/26/2014 5:46 AM     Mri Angiogram Head Wo Contrast    08/26/2014   HISTORY: Right paresthesia  TECHNIQUE: Multiplanar, multisequence MR imaging of the brain was performed without intravenous contrast. MRA imaging of the cervical and intracranial vasculature was also obtained.  COMPARISON: Prior head CT dated 08/26/2014  FINDINGS: There is encephalomalacia involving the inferomedial left cerebellar hemisphere, compatible with a chronic infarct. There is a small region of restricted diffusion along the lateral margin of the left medulla that measures up to approximately 1 cm which is consistent with  an acute infarct. There is no diffusion restriction elsewhere. There are small patchy T2/FLAIR hyperintensities in the supratentorial white matter, suggesting chronic small vessel ischemic changes.  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 There is no ventriculomegaly.  MRA imaging of the intracranial vasculature demonstrates patency of the intradural vertebral arteries. There is occlusion of the posterior inferior cerebellar artery on the left, with apparent thrombosis evident in this vessel along the lateral margin of the medulla. The basilar artery remains patent without significant stenosis.  The intracranial internal carotid arteries are patent and bifurcate into the anterior and middle cerebral arteries. There is no significant stenosis or large branch vessel occlusion in the anterior circulation. There is a tiny broad-based outpouching, directed superiorly, at the ICA terminus on the left which is suspicious for a small aneurysm, measuring up to 3 mm.  MRA imaging of the neck demonstrates antegrade flow in the carotid and vertebral arteries bilaterally. The carotid bifurcations are patent, with minimal narrowing of the proximal right ICA. The vertebral arteries appear codominant.      08/26/2014     1. 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 There is a chronic infarct in the inferomedial left cerebellar hemisphere. 2. There is no acute intracranial hemorrhage or mass effect. 3. 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 A follow-up MRA in 6-12 months is recommended to assess stability. 4. No significant stenosis is identified in the cervical vasculature.  Findings were discussed with the ordering physician, Dr. Carlyon Shadow at 1:03 PM on 08/26/2014  Nicoletta Dress, MD  08/26/2014 1:06 PM     Mri Angiogram Neck Wo Contrast    08/26/2014   HISTORY: Right  paresthesia  TECHNIQUE: Multiplanar, multisequence MR imaging of the brain was performed without intravenous contrast. MRA imaging of the cervical and intracranial vasculature was also obtained.  COMPARISON: Prior head CT dated 08/26/2014  FINDINGS: There is encephalomalacia involving the inferomedial left cerebellar hemisphere, compatible with a chronic infarct. There is a small region of restricted diffusion along the lateral margin of the left medulla that measures up to approximately 1 cm which is consistent with an acute infarct. There is no diffusion restriction elsewhere. There are small patchy T2/FLAIR hyperintensities in the supratentorial white matter, suggesting  chronic small vessel ischemic changes.  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 There is no ventriculomegaly.  MRA imaging of the intracranial vasculature demonstrates patency of the intradural vertebral arteries. There is occlusion of the posterior inferior cerebellar artery on the left, with apparent thrombosis evident in this vessel along the lateral margin of the medulla. The basilar artery remains patent without significant stenosis.  The intracranial internal carotid arteries are patent and bifurcate into the anterior and middle cerebral arteries. There is no significant stenosis or large branch vessel occlusion in the anterior circulation. There is a tiny broad-based outpouching, directed superiorly, at the ICA terminus on the left which is suspicious for a small aneurysm, measuring up to 3 mm.  MRA imaging of the neck demonstrates antegrade flow in the carotid and vertebral arteries bilaterally. The carotid bifurcations are patent, with minimal narrowing of the proximal right ICA. The vertebral arteries appear codominant.      08/26/2014     1. 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 There is a chronic infarct in the inferomedial  left cerebellar hemisphere. 2. There is no acute intracranial hemorrhage or mass effect. 3. 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 A follow-up MRA in 6-12 months is recommended to assess stability. 4. No significant stenosis is identified in the cervical vasculature.  Findings were discussed with the ordering physician, Dr. Carlyon Shadow at 1:03 PM on 08/26/2014  Nicoletta Dress, MD  08/26/2014 1:06 PM     Mri Brain Wo Contrast    08/26/2014   HISTORY: Right paresthesia  TECHNIQUE: Multiplanar, multisequence MR imaging of the brain was performed without intravenous contrast. MRA imaging of the cervical and intracranial vasculature was also obtained.  COMPARISON: Prior head CT dated 08/26/2014  FINDINGS: There is encephalomalacia involving the inferomedial left cerebellar hemisphere, compatible with a chronic infarct. There is a small region of restricted diffusion along the lateral margin of the left medulla that measures up to approximately 1 cm which is consistent with an acute infarct. There is no diffusion restriction elsewhere. There are small patchy T2/FLAIR hyperintensities in the supratentorial white matter, suggesting chronic small vessel ischemic changes.  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 There is no ventriculomegaly.  MRA imaging of the intracranial vasculature demonstrates patency of the intradural vertebral arteries. There is occlusion of the posterior inferior cerebellar artery on the left, with apparent thrombosis evident in this vessel along the lateral margin of the medulla. The basilar artery remains patent without significant stenosis.  The intracranial internal carotid arteries are patent and bifurcate into the anterior and middle cerebral arteries. There is no significant stenosis or large branch vessel occlusion in the anterior circulation. There is a tiny broad-based outpouching, directed superiorly,  at the ICA terminus on the left which is suspicious for a small aneurysm, measuring up to 3 mm.  MRA imaging of the neck demonstrates antegrade flow in the carotid and vertebral arteries bilaterally. The carotid bifurcations are patent, with minimal narrowing of the proximal right ICA. The vertebral arteries appear codominant.      08/26/2014     1. 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 There is a chronic infarct in the inferomedial left cerebellar hemisphere. 2. There is no acute intracranial hemorrhage  or mass effect. 3. 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 A follow-up MRA in 6-12 months is recommended to assess stability. 4. No significant stenosis is identified in the cervical vasculature.  Findings were discussed with the ordering physician, Dr. Carlyon Shadow at 1:03 PM on 08/26/2014  Nicoletta Dress, MD  08/26/2014 1:06 PM     US Abdomen Limited Ruq    08/26/2014   HISTORY: Abnormal LFTs.  COMPARISON: None.  RIGHT UPPER QUADRANT ABDOMINAL ULTRASOUND:  The liver is normal in size with mild increased echogenicity, heterogeneous echotexture and subtle surface nodularity, suggesting underlying cirrhosis. No focal mass is seen in the liver given decreased sensitivity with diffuse parenchymal abnormality. The intrahepatic biliary ducts are nondilated.  The gallbladder is visualized and contains no sludge or stones. No gallbladder wall thickening or pericholecystic fluid is seen to suggest cholecystitis.   The common bile duct is nondilated measuring 3 mm.  The visualized portions of the pancreas were unremarkable.   The visualized portions of the abdominal aorta were non aneurysmal.  The visualized portions of the IVC are unremarkable. 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 There is no  hydronephrosis.     08/26/2014   . Diffuse hepatic parenchymal abnormality suggesting cirrhosis.  Geanie Cooley, MD  08/26/2014 5:38 PM     Chest Ap Portable    08/26/2014   INDICATION: stroke evaluation.  54 year old male with history of diabetes. Suspected stroke.           COMPARISON: No prior chest x-ray with which to compare.        TECHNIQUE: XR CHEST AP PORTABLE: AP portable upright  FINDINGS:     No indwelling lines are appreciated. The patient is in lordotic position.     The lungs are grossly clear. There is no silhouetting of the diaphragms or cardio-mediastinal contours.  There is no evidence of pulmonary edema.      There is no pneumothorax, pleural effusion or other pleural pathology.      The heart is likely normal in size when allowances are made for technical factors.      There is no mediastinal shift and the upper mediastinum does not appear widened.      Hilar structures are within normal limits.       There is no apparent subdiaphragmatic free air.          No significant bony lesions are seen.              08/26/2014    Portable AP radiographic examination of the chest shows no acute abnormality.                   Miguel Dibble, MD  08/26/2014 6:15 AM     US Venous Duplex Doppler Leg Bilateral    08/26/2014   History: Bilateral lower extremity paresthesias. CVA.  COMPARISON: None   Duplex  evaluation of the deep venous system of both lower extremities was performed for evaluation of DVT.   On each side the common femoral vein, femoral vein, and popliteal vein were visualized and appear unremarkable.  These veins are compressible with no evidence of thrombus.  Doppler demonstrates patency and normal directional and phasic flow.  Appropriate augmentation was observed.  The proximal deep femoral vein is visualized and appears unremarkable.  The portions of the posterior  tibial and peroneal veins that were seen appear unremarkable with no thrombus noted.  There is no indirect evidence of a calf vein  thrombus.     08/26/2014    No evidence of deep vein thrombosis involving either lower extremity.    Geanie Cooley, MD  08/26/2014 10:06 PM     Echocardiogram Adult Comp W Clr/dop/bubble    08/26/2014   ECHOCARDIOGRAM  Sonographer:  Elroy Channel Technical Quality: Adequate Indications:  CVA  Height (in):  74 Weight (lb):  2 57 Blood Pressure:  150/72    2-D Measurements  Left Ventricle                                           Diastolic Dimension:  57  (40-56 mm) Systolic Dimension:  38  (25-40 mm)      Septal Diastolic Thickness:  10  (6-11 mm)     Posterior Wall Thickness:  10  (6-11 mm)  Fractional Shortening Percentage:  33  (24-46 %)                        Visually Estimated Ejection Fraction:  65  (55-75 %)                           Right Ventricle Diastolic Dimension:  24  (7-26 mm)                             Left Atrium End Systolic Dimension:  37  (19-40 mm)                      Aortic Root:  41  (20-37 mm)                         Doppler Measurements and Color Flow Imaging Valves                                          Aortic Valve:  1.5  (0.9-1.8 m/s).          Regurgitation:  None  Pulmonic Valve:  1.0  (0.6-0.9 m/s).      Regurgitation:  Previa  Mitral Valve:  1.0  (0.6-1.4 m/s).           Regurgitation:  None  Tricuspid Valve:  0.5  (0.4-0.8 m/s.      Regurgitation:  None  Left Ventricular Outflow Tract Velocity:  1.4 m/s.   E/A Ratio:  1.2 positivie with valsalva Est. PASP:  15 mmHg Est. RA Mean Pressure:  Meters of mercury     08/26/2014     1.   The quality of the study is technically adequate for interpretation. 2.   The left ventricle is normal in size. Left ventricular systolic function is normal. There are no regional wall motion abnormalities. Estimated EF is 65%.        There is no left ventricular hypertrophy. The E to A ratio of the mitral diastolic flow is reversed, which indicates diastolic dysfunction .  3.  The left atrium is normal in size. 4.  The aortic valve is normal. Normal valve  excursion  is present. There is no aortic insufficiency. No aortic stenosis is present. The aortic root is mildly dilated in size. 5.   The mitral valve is structurally normal. No  is present. 6.   The right ventricle is normal in size and function. 7.   The right atrium is normal in size. 8.   The tricuspid valve is structurally normal. There is of tricuspid insufficiency. 9.   The pulmonic valve is structurally normal. There is trace of pulmonic insufficiency. 10. The RV systolic pressure is estimated at 15 mmHg, indicating no pulmonary hypertension.  11. Nopericardial effusion, intracardiac thrombi, or masses are seen. 12. The atrial septum is structurally  intact , small right to left shunt at the atrial level by bubble study shunt by color .   CONCLUSION:  Normal LV systolic function  Normal ejection fraction 65%  Decreased diastolic compliance the left ventricle  No valvular abnormalities  Agitated saline bubble study shows very small right to left shunt at the atrial level] PFO  Kristeen Mans, MD  08/26/2014 5:02 PM      Pathology:   Specimens     None             Hospitalist:   Signed by: Yarely Bebee V   08/27/2014 3:54 PM   Time spent for discharge: 40 minutes

## 2014-08-27 NOTE — Progress Notes (Signed)
08/27/14 1550   Discharge Disposition   Physical Discharge Disposition Home with Needs  (home health and d/c clinic info.)

## 2014-08-27 NOTE — Discharge Instructions (Signed)
Stop alcohol       Please make a follow up appointment with the [facility] on Monday 08/29/2014.  [facility] [place]# [phone].    [facility] health care agency- [facility] [place]#[phone], start of care will be within 24-48 hours.    Arm Care After a Stroke  Many people who have a stroke are left with problems with one of their arms. Proper arm care after a stroke can help treat these problems with your arm. It can also help prevent new problems from starting. Arm care may include placing the arms in the proper position, using devices such as a sling or brace, and taking care to prevent further injury during rehabilitation.  How a stroke can cause arm problems  Stroke often causes paralysis (hemiplegia) or weakness (hemiparalysis) of one or more of the muscles in your arm or shoulder. The muscles might feel tight instead of weak (spasticity). In general, stroke might increase or decrease the normal tension (muscle tone) in these muscles. You may also have numbness or limited feeling in your arm.  The shoulder is a main problem area after a stroke. The shoulder blade (scapula) and the upper arm bone (humerus) come together to form the shoulder joint. This joint is shaped like a ball and socket. Problems with the shoulder muscles can cause this joint to partly dislocate because of the weight of your arm. This partial separation (subluxation) makes your shoulder droop down.  Subluxation can cause pain when you move your arm. You may also feel like your shoulder is out of joint. Muscles, tendons, and ligaments can become overstretched. These muscle problems can lead to other problems with your shoulder. For example, you may not be able to move your shoulder as much as you used to. Some of the muscles may also be permanently shortened. This is called contracture.  How arm care helps  Arm care after a stroke helps prevent and treat problems. If you  have had a stroke, it is very likely that you will need some sort of arm care treatment while you recover function. Most people who have a stroke need treatment for trouble with the muscles of their arm or shoulder, and shoulder pain is common. This treatment often starts right after a stroke. Even if you only have minor harm from your stroke, proper arm care can help keep future problems from occurring.  Your treatment plan  Learn everything you can about your treatment plan. Your health care team will work with you to design a treatment plan to fit your needs. You may work with a physiatrist. This is a doctor who specializes in rehabilitative medicine. You will likely work with a physical therapist. This is a therapist who can teach you safe exercises to improve the strength, endurance, and range of motion in your arm, shoulder, and hand. An occupational therapist can help you learn to regain skills needed for everyday living using your arm. This may include using assistive devices, such as braces or arm rails.  Expect your treatment plan to change as you recover. Talk with the members of your medical team about how things are going. If an exercise causes pain, stop the exercise and let someone know right away.  Some people regain full use of their arm in the weeks after a stroke. Many others still have some weakness, pain, or other problems with their arm. You may continue to benefit from arm therapy. Your medical  team can tailor your treatment plan to your needs.  Protecting your shoulder joint  Preventing subluxation is one of the most important goals of arm care after a stroke. To prevent this problem, you must protect your arm at the shoulder joint. You will need to control the shoulder joint when you move around. Make sure all of your caregivers know about the proper ways to help you.   Do not let anyone pull on your arm.   Do not let anyone help you stand or move by lifting under your armpits.   Do not  lean your body weight on anything in your armpit while standing or walking.   Keep your affected arm supported and immobile when you stand up. Use your strong arm to help pull yourself up.   Keep your arm in a sling or harness after your stroke, if advised.   Support your affected arm while sitting. If you're in a wheelchair, rest it on one of the chair's arm. You can also rest your arm on things such as a lap tray or pillow.  Other types of proper positioning   When lying on your unaffected side, use 1 or 2 pillows for your head. Your affected shoulder should be forward, with your arm resting on a pillow.   When lying on your affected side, use 1 or 2 pillows for your head. Your affected shoulder should be positioned comfortably.   When sitting up, sit fully back into the chair. Place your arms forward onto 2 pillows on a table. Your feet should be flat on the floor.   When lying on your back, place 3 pillows supporting both your shoulders and your head. Place your affected arm on a pillow.   When sitting in bed, sit upright, well supported by pillows. Place both arms on pillows. This is usually only recommended for limited periods.  Your physical therapist may advise other positions that are safe for you. You may also do physical therapy exercises. These are to help you regain strength and flexibility in your muscles.  Additional treatments  If you continue to have arm problems, your health care team might try other treatments such as:   Constraint-induced movement therapy. This involves using your affected arm a lot and not using your unaffected arm. A therapist might help you with this. Or it could be robot-assisted.   Botulinum toxin injections. This can help to reduce tightness in the arm muscles.   Electrical stimulation of muscles. The weakened muscles in your arm or shoulder may be treated with electricity. This can helpstrengthen your weakened arm.   Electrical stimulation of the brain. This  may be done during rehabilitation exercises and may help you be able to move your arm better.   Motor imagery. This method may help you be able to use your arm more easily.   Biofeedback exercises. These may help you be able to move your arm better and reduce pain.   Pain medicine. These may be needed to ease shoulder pain if subluxation has occurred.  Depending on your situation, these treatments might be used early or late in your therapy. Ongoing physical therapy may also help you reduce chronic pain as you regain your strength and flexibility.   [address] [employer] [address] [place], [place] [address]. All rights reserved. This information is not intended as a substitute for professional medical care. Always follow your healthcare professional's instructions.  Exercise to Manage Your Blood Sugar  Being physically active every day can help you manage your blood sugar. That's because an active lifestyle can improve your body's ability to use insulin. Daily activity can also help delay or prevent complications of diabetes. And it's a great way to relieve stress. If you aren't normally active, be sure to consult your health care provider before getting started.  How Much Activity Do You Need?  If daily activity is new to you, start slow and steady. Begin with48minutes of activity each day. Then work up to at least40 minutes of moderate to high intensity physical activity on at least 3 to 4 days each week. Do this by adding a few minutes each week. It doesn't have to be done all at once. Each active period throughout the day adds up.  Just Move!  You don't have to join a gym or own pricey sports equipment. Just get out and walk. Walking is an aerobic exercise that makes your heart and lungs work hard. It helps your heart and blood vessels. Walking requires only a sturdy pair of sneakers and your own two feet. The more you walk, the easier it gets.   Schedule time every day to  move your feet.   Make it part of your daily routine.   Walk with a friend or a group to keep it interesting and fun.   Try taking several short walks during the day to meet your daily activity goal.    Adding Resistance Exercise  Resistance exercise (also called strength training), makes muscles stronger. It also helps muscles use insulin better. Ask your health care provider whether this type of exercise is right for you. If it is, your health care provider can help you work it in to your activity plan.  Staying Safe  Being active may cause blood sugar to drop faster than usual. This is especially true if you take medication to manage your blood sugar. But there are things you can do to help reduce the risk of accidental lows. Keep these tips in mind:   Always carry identification when you exercise outside your home. Carry a cell phone to use in case of emergency.   If you can, include friends and family in your activities.   Wear a medical ID bracelet that says you have diabetes.   Use the right safety equipment for the activity you do (such as a bicycle helmet when you ride a bicycle outdoors). Wear closed-toed shoes that fit your feet well.   Drink plenty of water before and during activity.   Keep a fast-acting sugar (such as glucose tablets) on hand in case of low blood sugar.   Dress properly for the weather. Wear a hat if it's sunny, or wait until evening if it's too hot.   Avoid being active for long periods in very hot or very cold weather.   Skip activity if you're sick.       [address] [employer]. [address], [place], [place] [address]. All rights reserved. This information is not intended as a substitute for professional medical care. Always follow your healthcare professional's instructions.          Using a Blood Sugar Log  You have diabetes. This means your body has troubleregulating a sugar called glucose. To help manage your diabetes, you'll need to check your  blood sugar level as directed by your health care provider. Keeping a log of your blood sugar levels will  help you track your blood sugar readings. It's a simple and easy way to see how well you are controlling your diabetes.    Checking your blood sugar level  You can check your blood sugar level with a blood glucose meter. You'll first use a tiny lancet to draw a tiny drop of blood from the side of a finger. Some glucose meters let you use another place on your body to test. But these other places should not be used in some cases. Follow the instructions for your glucose meter. And talk with your health care provider before doing the test on other places.  The strip goes into the meter first, then a drop of blood is placed on the tip of the strip. The meter then shows a reading that tells you the level of your blood sugar. Your readings should be in your target range as often as possible. This means not too high or too low. Staying in this range helps lower your risk for complications. Your doctor will help you figure out the target range that is best for you.  Tracking your readings  Every time you check your blood sugar, use your log to keep track of your readings. You may be advised by your doctor to check your blood sugar in the morning, at bedtime, and before and after meals. Be sure to write down all of your numbers. Also use your log to record things that might have affected your blood sugar. Some examples include being sick, being physically active, feeling stressed, or skipping meals.  Lessons learned from your readings  Tracking your blood sugar readings helps you see patterns. These patterns tell you how your actions affect your blood sugar. For instance, you may have higher numbers after eating certain foods or lower numbers after exercise. Keep in mind that there are no "good" or "bad" numbers. They just help you understand how to stay in your target range more often, so that your diabetes remains in  good control.  Sharing your log with your health care team  Bring your blood sugar log with you to all of your health care appointments. It can help your doctor and other members of your health care team make changes to your treatment plan, if needed. This may involve making changes in what you eat, what medicines you take, or how much you exercise.  To learn more  The resources below can help you learn more:   American Diabetes Association800-342-2383www.diabetes.org   [employer]800-829-0500www.lighthouse.[phone])704-2751 [place]   [employer] [phone]   [address] [place]. [address], [place], [place] [address]. All rights reserved. This information is not intended as a substitute for professional medical care. Always follow your healthcare professional's instructions.

## 2014-08-27 NOTE — Progress Notes (Signed)
MODIFIED RANKIN SCALE    0 No symptoms at all.    1 No significant disability despite symptoms; able to carry out all usual duties and activities.    2 Slight disability; unable to carry out all previous activities, but able to look after own affairs without assistance.    3 Moderate disability; requiring some help, but able to walk without assistance.    4 Moderately severe disability; unable to walk without assistance and unable to attend to own bodily needs without assistance.    5 Severe disability; bedridden, incontinent and requiring constant nursing care and attention.    6 Dead.      TOTAL (0-6): 1

## 2014-08-27 NOTE — Progress Notes (Addendum)
Treatment team recommended home health and d/c clinic appt for the patient. After several choices for home health were provided the patient chose [facility] home [facility]. Referral for home health placed via [facility]. The following information listed below has been placed on the patient's discharge instructions for follow up:     Please make a follow up appointment with the [facility] on Monday 08/29/2014.  [facility] [place]# [phone].      [facility] home health care agency- [facility] [place]#[phone], start of care will be within 24-48 hours.

## 2014-08-27 NOTE — Plan of Care (Signed)
Problem: Day of Admission- Stroke  Goal: Neurological status is stable or improving  Intervention: Monitor/assess Glasgow Coma Scale.  Pt is 15 on GCS  Intervention: Monitor/assess NIH Stroke Scale.  Pt is O on the NIH  Intervention: Observe for seizure activity and initiate seizure precautions if indicated.  Pt has not had any seizure activity    Goal: Nutritional Status Improving  Outcome: Progressing  Pt has a healthy appetite  Intervention: IV hydrate if NPO  Pt able to drink sufficient amount

## 2014-09-05 ENCOUNTER — Encounter (INDEPENDENT_AMBULATORY_CARE_PROVIDER_SITE_OTHER): Payer: Self-pay | Admitting: "Endocrinology

## 2014-09-05 ENCOUNTER — Ambulatory Visit (INDEPENDENT_AMBULATORY_CARE_PROVIDER_SITE_OTHER): Payer: Worker's Comp, Other unspecified | Admitting: "Endocrinology

## 2014-09-05 ENCOUNTER — Ambulatory Visit (INDEPENDENT_AMBULATORY_CARE_PROVIDER_SITE_OTHER): Payer: Charity

## 2014-09-05 VITALS — BP 157/84 | HR 107 | Temp 98.2°F | Resp 16 | Wt 252.8 lb

## 2014-09-05 DIAGNOSIS — E118 Type 2 diabetes mellitus with unspecified complications: Secondary | ICD-10-CM

## 2014-09-05 DIAGNOSIS — I63342 Cerebral infarction due to thrombosis of left cerebellar artery: Secondary | ICD-10-CM

## 2014-09-05 LAB — POCT GLUCOSE: Whole Blood Glucose POCT: 303 mg/dL — AB (ref 70–100)

## 2014-09-05 MED ORDER — GLIPIZIDE 5 MG PO TABS
5.0000 mg | ORAL_TABLET | Freq: Two times a day (BID) | ORAL | Status: AC
Start: 2014-09-05 — End: ?

## 2014-09-05 MED ORDER — METFORMIN HCL 1000 MG PO TABS
1000.0000 mg | ORAL_TABLET | Freq: Two times a day (BID) | ORAL | Status: AC
Start: 2014-09-05 — End: ?

## 2014-09-05 MED ORDER — RAMIPRIL 5 MG PO CAPS
5.0000 mg | ORAL_CAPSULE | Freq: Every day | ORAL | Status: AC
Start: 2014-09-05 — End: ?

## 2014-09-05 NOTE — Progress Notes (Signed)
History of Present Illness:     This patient is a 54 y.o. male, who is from [place], here for work, with PMH significant for DM, HTN, HLD, and chronic alcohol, here for his initial ITS visit after his recent hospitalization for acute stroke (left lateral medullary, thrombosis of posterior inferior cerebellar artery).  Neurology, Dr. Alfredo Martinez and NS, Dr. Francetta Found was consulted, was not deemed to be a candidate for surgery due to increased risk of bleeding.  He was started on Aspirin 325 mg.  Echo showed PFO, he is on Aspirin, he was instructed to flu with Cardiology for TEE.  Elevated LFTs and thrombocytopenia secondary to chronic alcohol use, RUQ US showed possible cirrhosis, recommended GI flu for EGD and liver bx.     He presents to clinic today, feels better overall:    1) Stroke - he continues to have decreased sensation on his left arm and left leg, otherwise he denies any focal weakness on the left.  He denies any changes in mental status, visual disturbance, gait instability, tremors, seizures or seizure-like activities.     2) HTN - he denies headache, dizziness, chest pain or palpitation.  He has not started the Altace as prescribed, states he does not have the Rx for Altace.  POCT BP elevated at 157/84.     3) Diabetes - he has diabetes for many years, he is not consistent as far as taking his diabetes medication.  He is not monitoring his BS as well.  A1C 7.2.  POCT post prandial BS 303.  He denies hyperglycemic sx. He is taking the Glipizide only once daily instead of the prescribed twice daily.     4) Possible liver cirrhosis - he denies any GI sx, he has no extremity edema or abdominal distention.  He denies insomnia.      5) PFO - discussed with patient need to have EEG.     6) Chronic alcohol use - reports last drink was yesterday, took 2 cans of beer.  Reports that this is significantly lesser to what he was used to drinking.  Extensive counseling provided - risk or continued alcohol,  worsening liver cirrhosis, risk for another stroke, etc.     Review of Systems:     Review of Systems   Constitutional: Negative for fever, chills, malaise/fatigue and diaphoresis.   HENT: Negative for congestion.    Eyes: Negative for blurred vision, double vision and photophobia.   Respiratory: Negative for shortness of breath and wheezing.    Cardiovascular: Negative for chest pain, palpitations, orthopnea, claudication, leg swelling and PND.   Gastrointestinal: Negative for heartburn, nausea, vomiting and abdominal pain.   Genitourinary: Negative for dysuria.   Musculoskeletal: Negative for myalgias and joint pain.   Skin: Negative for itching and rash.   Neurological: Positive for sensory change (decreased sensation LUE and LLE). Negative for dizziness, tingling, tremors, speech change, focal weakness, seizures, loss of consciousness, weakness and headaches.   Endo/Heme/Allergies: Does not bruise/bleed easily.   Psychiatric/Behavioral: Negative for depression and suicidal ideas. The patient is not nervous/anxious.        Physical Exam:     Filed Vitals:    09/05/14 1302   BP: 157/84   Pulse: 107   Temp: 98.2 F (36.8 C)   Resp: 16   SpO2: 96%       Wt Readings from Last 3 Encounters:   09/05/14 114.669 kg (252 lb 12.8 oz)   08/26/14 116.574 kg (257 lb)  General: awake, alert, oriented x 3, in no apparent distress   HEENT: perrla, eomi, sclera anicteric,oropharynx clear without lesions, mucous membranes moist  Neck: supple, no lymphadenopathy, no thyromegaly, no JVD, no carotid bruits  Cardiovascular: S1, S2, regular rate and rhythm, no murmurs, rubs, or gallops  Lungs: clear to auscultation bilaterally, without wheezing, rhonchi, or rales  Abdomen: soft, non tender, normal bowel sounds  Extremities: no clubbing, cyanosis, or edema  Neuro: cranial nerves grossly intact, strength 5/5 in upper and lower extremities, decreased sensation LUE and LLE   Skin: no rashes or lesions noted    Diagnostics:     Lab  Results   Component Value Date    WBC 4.60 08/27/2014    HGB 15.6 08/27/2014    HCT 45.7 08/27/2014    PLT 87* 08/27/2014    CHOL 234* 08/26/2014    TRIG 226* 08/26/2014    HDL 53 08/26/2014    LDL 136* 08/26/2014    ALT 46 08/27/2014    AST 62* 08/27/2014    NA 138 08/27/2014    K 4.3 08/27/2014    CL 107 08/27/2014    CREAT 0.7 08/27/2014    BUN 9.5 08/27/2014    CO2 24 08/27/2014    INR 1.2 08/26/2014    GLU 164* 08/27/2014    HGBA1C 7.2* 08/26/2014       No results found.    Assessment:     Patient Active Problem List   Diagnosis   . TIA (transient ischemic attack)   . Paresthesia of right upper and lower extremity   . Type 2 diabetes mellitus   . Abnormal LFTs   . Essential hypertension   . Alcohol abuse   . Thrombocytopenia   . CVA (cerebral vascular accident)   . Cerebrovascular accident (CVA) due to thrombosis   . 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 Type 2 diabetes mellitus without complication       Plan:     Simone was seen today for diabetes.    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  -     ramipril (ALTACE) 5 MG capsule; Take 1 capsule (5 mg total) by mouth daily.  -     metFORMIN (GLUCOPHAGE) 1000 MG tablet; Take 1 tablet (1,000 mg total) by mouth 2 (two) times daily with meals.    1) Stroke - continue to have decreased sensation left side but otherwise neurologically stable - continue Aspirin and Atorvastatin - flu with Neurology, Dr. Alfredo Martinez and NS, Dr. Francetta Found - given his extent of stroke as well as the physical nature of his time and working night shift,  I would defer clearance to go back to work to either  Neurologist or NS.     2) HTN - elevated - start Ramipril 5 mg. PO daily. New Rx given     3) DM - POCT BS elevated - take the correct dose of Glipizide 5 mg. PO BID.  Continue current dose  of Metformin.  Check BS BID. Glucometer given to patient.  Diabetic education reinforced.     4) PFO - Cardiology referral for TEE - patient prefers to seek Cardiology consult when he returns to [facility] in a few weeks    5) Possible liver cirrhosis - GI referral - patient prefers to seek GI referral in [place]    6) Alcohol abuse - extensive counseling provided  - declined alcohol rehab resources.      FOLLOW-UP PLAN: 1 week     PROGRESS ON ESTABLISHING A MEDICAL HOME:going back to [facility]

## 2014-09-05 NOTE — Progress Notes (Signed)
Nurse case coordinator introduce self to patient and Case coordinating services from [facility]. Patient verb und in role  Dispo: Meeting with patient to discuss transition /disposition plan with patient. Patient  Is here temporarily for work from [facility]. Pt plans to go back in 1 month. Pt does not have insurance. Pt does have a PCP back in [place].  Assisted patient in scheduling appt with Neurosurgeon. Provided information in witting:  Dr Sherril Croon NeuroSurgeon:  Wednesday Sept 14, at 11:00am  [address] [facility] [address] [place].   [phone]  Nicholos Johns from [facility] office will call if any earlier appt become available.  Pt provided with list of self pay Neurologist in [employer]  Assisted patient in completing [place] documents. Faxed to [employer] office. Pt needs to bring tax forms at f/u visit to fax proof of income.  PATIENT EDUCATION    1. Education was provided on the following: diabetes    2. Was teach-back completed? Yes    Summary:  Patient instructed in the correct way to check blood sugar. Verbal and demonstration. Patient returned demonstration. patient states no further questions.Diabetic supplies given. Patient instructed to contact writer if any issues with meter. Reinforced the importance to bring log to appt during next visit.  Good Rx discount card given to patient.    All questions answered.Patient verb und and agreed with POC

## 2014-09-05 NOTE — Patient Instructions (Signed)
1) Increase your Glipizide 5 mg. 1 tablet by mouth from once daily to twice daily.  Please take this medicine 10 to 15 minutes before breakfast and before dinner.     2) Start taking your Altace (Ramipril) 5 mg. 1 tablet by mouth daily in the morning.     3) Continue to take all your other medications as prescribed.     4) Diabetes Melitus  Check your blood sugar in the morning, before breakfast,  and 2 hours after dinner.   Record your blood sugar readings in a note book and return with note book at next visit.      5) Hypoglycemia (low blood sugar) and hyperglycemia (high blood sugar) instructions  Symptoms of Hypoglycemia (low blood sugars)                      Feeling shaky, weak or hungry                      Dizziness or lightheadedness            Numbness and tingling of the lips                  Sweating                                                        Headaches                                                    Problems with vision    What to do if you experience hypoglycemia (low blood sugars)  Eat or drink something with 15 grams of carbs  1/2 cup of fruit juice e.g orange juice  1/2 cup of soft drink (avoid diet drinks)  Glucose tablets   1 tablespoon of honey    Symptoms of Hyperglycemia  (high blood sugars)   Feeling tired or fatigued  Increased thirst  Frequent urination  Blurred vision  Headaches    5)   Follow up with Bruna Potter, MD In 1 week.     Specialty: Neurology    Contact information:    [address]   [address]   [place] [address]   [phone]           Follow up with Grover Canavan, MD PHD     Specialty: Neurological Surgery    Contact information:    [address]   [address]   [place] [address]   [phone]      6) Please follow-up with Cardiology as soon as possible for transesophageal echocardiogram for further evaluation of your patent foramen ovale (PFO) -  Patent foramen ovale (PFO) is a hole between the left and right atria (upper chambers) of the heart.  This hole exists in everyone before birth, but most often closes shortly after being born. PFO is what the hole is called when it fails to close naturally after a baby is born.    7) Please follow-up with a Gastroenterologist as soon as possible for a possible liver cirrhosis that was seen on the ultrasound of your liver.

## 2014-09-12 ENCOUNTER — Ambulatory Visit (INDEPENDENT_AMBULATORY_CARE_PROVIDER_SITE_OTHER): Payer: Self-pay | Admitting: "Endocrinology

## 2014-09-28 ENCOUNTER — Ambulatory Visit (INDEPENDENT_AMBULATORY_CARE_PROVIDER_SITE_OTHER): Payer: Self-pay | Admitting: Neurological Surgery

## 2016-02-19 ENCOUNTER — Inpatient Hospital Stay (HOSPITAL_COMMUNITY): Payer: Self-pay

## 2016-02-19 ENCOUNTER — Encounter (HOSPITAL_COMMUNITY): Payer: Self-pay | Admitting: Emergency Medicine

## 2016-02-19 ENCOUNTER — Inpatient Hospital Stay (HOSPITAL_COMMUNITY)
Admission: EM | Admit: 2016-02-19 | Discharge: 2016-02-25 | DRG: 728 | Disposition: A | Discharge status: Home / Self Care | Payer: Self-pay | Attending: Internal Medicine | Admitting: Internal Medicine

## 2016-02-19 DIAGNOSIS — E119 Type 2 diabetes mellitus without complications: Secondary | ICD-10-CM | POA: Diagnosis present

## 2016-02-19 DIAGNOSIS — Z79899 Other long term (current) drug therapy: Secondary | ICD-10-CM

## 2016-02-19 DIAGNOSIS — Z885 Allergy status to narcotic agent status: Secondary | ICD-10-CM

## 2016-02-19 DIAGNOSIS — R918 Other nonspecific abnormal finding of lung field: Secondary | ICD-10-CM | POA: Diagnosis present

## 2016-02-19 DIAGNOSIS — F1010 Alcohol abuse, uncomplicated: Secondary | ICD-10-CM | POA: Diagnosis present

## 2016-02-19 DIAGNOSIS — Z8673 Personal history of transient ischemic attack (TIA), and cerebral infarction without residual deficits: Secondary | ICD-10-CM

## 2016-02-19 DIAGNOSIS — K7290 Hepatic failure, unspecified without coma: Secondary | ICD-10-CM | POA: Diagnosis present

## 2016-02-19 DIAGNOSIS — M79606 Pain in leg, unspecified: Secondary | ICD-10-CM

## 2016-02-19 DIAGNOSIS — K7030 Alcoholic cirrhosis of liver without ascites: Secondary | ICD-10-CM

## 2016-02-19 DIAGNOSIS — D6959 Other secondary thrombocytopenia: Secondary | ICD-10-CM | POA: Diagnosis present

## 2016-02-19 DIAGNOSIS — Z87891 Personal history of nicotine dependence: Secondary | ICD-10-CM

## 2016-02-19 DIAGNOSIS — E669 Obesity, unspecified: Secondary | ICD-10-CM | POA: Diagnosis present

## 2016-02-19 DIAGNOSIS — N5082 Scrotal pain: Secondary | ICD-10-CM

## 2016-02-19 DIAGNOSIS — D696 Thrombocytopenia, unspecified: Secondary | ICD-10-CM

## 2016-02-19 DIAGNOSIS — N498 Inflammatory disorders of other specified male genital organs: Secondary | ICD-10-CM

## 2016-02-19 DIAGNOSIS — Z6833 Body mass index (BMI) 33.0-33.9, adult: Secondary | ICD-10-CM

## 2016-02-19 DIAGNOSIS — K7031 Alcoholic cirrhosis of liver with ascites: Secondary | ICD-10-CM | POA: Diagnosis present

## 2016-02-19 DIAGNOSIS — N492 Inflammatory disorders of scrotum: Principal | ICD-10-CM | POA: Diagnosis present

## 2016-02-19 DIAGNOSIS — E8779 Other fluid overload: Secondary | ICD-10-CM | POA: Diagnosis present

## 2016-02-19 DIAGNOSIS — Z7984 Long term (current) use of oral hypoglycemic drugs: Secondary | ICD-10-CM

## 2016-02-19 DIAGNOSIS — I10 Essential (primary) hypertension: Secondary | ICD-10-CM | POA: Diagnosis present

## 2016-02-19 DIAGNOSIS — R188 Other ascites: Secondary | ICD-10-CM

## 2016-02-19 DIAGNOSIS — I509 Heart failure, unspecified: Secondary | ICD-10-CM

## 2016-02-19 DIAGNOSIS — E8809 Other disorders of plasma-protein metabolism, not elsewhere classified: Secondary | ICD-10-CM | POA: Diagnosis present

## 2016-02-19 DIAGNOSIS — Z7982 Long term (current) use of aspirin: Secondary | ICD-10-CM

## 2016-02-19 DIAGNOSIS — R0600 Dyspnea, unspecified: Secondary | ICD-10-CM

## 2016-02-19 LAB — GLUCOSE, CAPILLARY
GLUCOSE-CAPILLARY: 182 mg/dL — AB (ref 65–99)
Glucose-Capillary: 78 mg/dL (ref 65–99)

## 2016-02-19 LAB — CBC WITH DIFFERENTIAL/PLATELET
Basophils Absolute: 0 10*3/uL (ref 0.0–0.1)
Basophils Relative: 1 %
Eosinophils Absolute: 0.1 10*3/uL (ref 0.0–0.7)
Eosinophils Relative: 3 %
HEMATOCRIT: 36.4 % — AB (ref 39.0–52.0)
Hemoglobin: 12.2 g/dL — ABNORMAL LOW (ref 13.0–17.0)
LYMPHS ABS: 0.9 10*3/uL (ref 0.7–4.0)
LYMPHS PCT: 21 %
MCH: 34.3 pg — ABNORMAL HIGH (ref 26.0–34.0)
MCHC: 33.5 g/dL (ref 30.0–36.0)
MCV: 102.2 fL — AB (ref 78.0–100.0)
Monocytes Absolute: 0.7 10*3/uL (ref 0.1–1.0)
Monocytes Relative: 18 %
Neutro Abs: 2.4 10*3/uL (ref 1.7–7.7)
Neutrophils Relative %: 58 %
PLATELETS: 67 10*3/uL — AB (ref 150–400)
RBC: 3.56 MIL/uL — AB (ref 4.22–5.81)
RDW: 17 % — ABNORMAL HIGH (ref 11.5–15.5)
WBC: 4.1 10*3/uL (ref 4.0–10.5)

## 2016-02-19 LAB — URINALYSIS, MICROSCOPIC (REFLEX)
RBC / HPF: NONE SEEN RBC/hpf (ref 0–5)
Squamous Epithelial / LPF: NONE SEEN

## 2016-02-19 LAB — COMPREHENSIVE METABOLIC PANEL
ALK PHOS: 151 U/L — AB (ref 38–126)
ALT: 36 U/L (ref 17–63)
AST: 67 U/L — AB (ref 15–41)
Albumin: 2.1 g/dL — ABNORMAL LOW (ref 3.5–5.0)
Anion gap: 5 (ref 5–15)
BILIRUBIN TOTAL: 8.4 mg/dL — AB (ref 0.3–1.2)
BUN: 7 mg/dL (ref 6–20)
CALCIUM: 8.3 mg/dL — AB (ref 8.9–10.3)
CHLORIDE: 105 mmol/L (ref 101–111)
CO2: 26 mmol/L (ref 22–32)
Creatinine, Ser: 0.48 mg/dL — ABNORMAL LOW (ref 0.61–1.24)
GFR calc Af Amer: >60 mL/min (ref >60)
Glucose, Bld: 83 mg/dL (ref 65–99)
Potassium: 3.7 mmol/L (ref 3.5–5.1)
Sodium: 136 mmol/L (ref 135–145)
Total Protein: 7.4 g/dL (ref 6.5–8.1)

## 2016-02-19 LAB — PROTEIN / CREATININE RATIO, URINE
CREATININE, URINE: 144.67 mg/dL
Total Protein, Urine: <3 mg/dL

## 2016-02-19 LAB — RAPID URINE DRUG SCREEN, HOSP PERFORMED
AMPHETAMINES: NOT DETECTED
BENZODIAZEPINES: NOT DETECTED
Barbiturates: NOT DETECTED
Cocaine: NOT DETECTED
Opiates: NOT DETECTED
Tetrahydrocannabinol: NOT DETECTED

## 2016-02-19 LAB — URINALYSIS, ROUTINE W REFLEX MICROSCOPIC
Glucose, UA: 100 mg/dL — AB
HGB URINE DIPSTICK: NEGATIVE
Leukocytes, UA: NEGATIVE
Nitrite: NEGATIVE
Specific Gravity, Urine: >1.03 — ABNORMAL HIGH (ref 1.005–1.030)
pH: 5.5 (ref 5.0–8.0)

## 2016-02-19 LAB — TSH: TSH: 1.135 u[IU]/mL (ref 0.350–4.500)

## 2016-02-19 LAB — PROTIME-INR
INR: 2
Prothrombin Time: 23 seconds — ABNORMAL HIGH (ref 11.4–15.2)

## 2016-02-19 LAB — APTT: APTT: 40 s — AB (ref 24–36)

## 2016-02-19 MED ORDER — LORAZEPAM 1 MG PO TABS
1.0000 mg | ORAL_TABLET | Freq: Four times a day (QID) | ORAL | Status: AC | PRN
  Administered 2016-02-20: 1 mg via ORAL
  Filled 2016-02-19: qty 1

## 2016-02-19 MED ORDER — HYDRALAZINE HCL 20 MG/ML IJ SOLN: 5.0000 mg | Freq: Four times a day (QID) | INTRAMUSCULAR | Status: DC | PRN

## 2016-02-19 MED ORDER — INSULIN ASPART 100 UNIT/ML ~~LOC~~ SOLN
0.0000 [IU] | Freq: Three times a day (TID) | SUBCUTANEOUS | Status: DC
  Administered 2016-02-20 – 2016-02-22 (×6): 2 [IU] via SUBCUTANEOUS
  Administered 2016-02-23: 3 [IU] via SUBCUTANEOUS
  Administered 2016-02-23: 2 [IU] via SUBCUTANEOUS
  Administered 2016-02-23: 1 [IU] via SUBCUTANEOUS
  Administered 2016-02-24: 2 [IU] via SUBCUTANEOUS
  Administered 2016-02-24: 5 [IU] via SUBCUTANEOUS
  Administered 2016-02-25: 3 [IU] via SUBCUTANEOUS
  Administered 2016-02-25: 1 [IU] via SUBCUTANEOUS

## 2016-02-19 MED ORDER — VITAMIN B-1 100 MG PO TABS
100.0000 mg | ORAL_TABLET | Freq: Every day | ORAL | Status: DC
  Administered 2016-02-19 – 2016-02-25 (×7): 100 mg via ORAL
  Filled 2016-02-19 (×7): qty 1

## 2016-02-19 MED ORDER — VANCOMYCIN HCL IN DEXTROSE 1-5 GM/200ML-% IV SOLN
1000.0000 mg | Freq: Two times a day (BID) | INTRAVENOUS | Status: DC
  Administered 2016-02-19 – 2016-02-23 (×8): 1000 mg via INTRAVENOUS
  Filled 2016-02-19 (×10): qty 200

## 2016-02-19 MED ORDER — PANTOPRAZOLE SODIUM 40 MG PO TBEC
80.0000 mg | DELAYED_RELEASE_TABLET | Freq: Every day | ORAL | Status: DC
  Administered 2016-02-20 – 2016-02-25 (×6): 80 mg via ORAL
  Filled 2016-02-19 (×7): qty 2

## 2016-02-19 MED ORDER — VANCOMYCIN HCL IN DEXTROSE 1-5 GM/200ML-% IV SOLN
1000.0000 mg | Freq: Once | INTRAVENOUS | Status: AC
  Administered 2016-02-19: 1000 mg via INTRAVENOUS
  Filled 2016-02-19: qty 200

## 2016-02-19 MED ORDER — ONDANSETRON HCL 4 MG/2ML IJ SOLN
4.0000 mg | Freq: Four times a day (QID) | INTRAMUSCULAR | Status: DC | PRN
  Administered 2016-02-23: 4 mg via INTRAVENOUS
  Filled 2016-02-19: qty 2

## 2016-02-19 MED ORDER — LORAZEPAM 2 MG/ML IJ SOLN: 1.0000 mg | Freq: Four times a day (QID) | INTRAMUSCULAR | Status: AC | PRN

## 2016-02-19 MED ORDER — ADULT MULTIVITAMIN W/MINERALS CH
1.0000 | ORAL_TABLET | Freq: Every day | ORAL | Status: DC
  Administered 2016-02-19 – 2016-02-25 (×7): 1 via ORAL
  Filled 2016-02-19 (×7): qty 1

## 2016-02-19 MED ORDER — THIAMINE HCL 100 MG/ML IJ SOLN
100.0000 mg | Freq: Every day | INTRAMUSCULAR | Status: DC
  Filled 2016-02-19: qty 2

## 2016-02-19 MED ORDER — ACETAMINOPHEN 650 MG RE SUPP: 650.0000 mg | Freq: Four times a day (QID) | RECTAL | Status: DC | PRN

## 2016-02-19 MED ORDER — ONDANSETRON HCL 4 MG PO TABS
4.0000 mg | ORAL_TABLET | Freq: Four times a day (QID) | ORAL | Status: DC | PRN
  Administered 2016-02-23: 4 mg via ORAL
  Filled 2016-02-19: qty 1

## 2016-02-19 MED ORDER — FOLIC ACID 1 MG PO TABS
1.0000 mg | ORAL_TABLET | Freq: Every day | ORAL | Status: DC
  Administered 2016-02-19 – 2016-02-25 (×7): 1 mg via ORAL
  Filled 2016-02-19 (×7): qty 1

## 2016-02-19 MED ORDER — ACETAMINOPHEN 325 MG PO TABS
650.0000 mg | ORAL_TABLET | Freq: Four times a day (QID) | ORAL | Status: DC | PRN
  Administered 2016-02-20 – 2016-02-23 (×2): 650 mg via ORAL
  Filled 2016-02-19 (×2): qty 2

## 2016-02-19 NOTE — H&P (Signed)
History and Physical  [name] ZOX:[id number] DOB: 03/08/1960 DOA: 02/19/2016   PCP: [facility]   Patient coming from: Home  Chief Complaint: scrotal pain and edema  HPI:  [name] is a 56 y.o. male with medical history of diabetes mellitus, hypertension, stroke, alcohol abuse presented with a 5 day history of scrotal erythema and edema and pain. The patient first noticed erythema on his scrotum on 02/14/2016. On fiber second 2018, the patient began noticing increasing edema of his scrotum to the point of encompassing his penis. He has some subjective chills without fevers. He denied any nausea, vomiting, diarrhea, dysuria. However, he came to the emergency department on 02/19/2016 because he was having difficulty urinating because of progressive edema. In addition, the patient has been complaining of shortness of breath for the past 14 months which has worsened significantly over the past 2 weeks. He states that he is having difficulty walking from his bedroom to the bathroom without shortness of breath. He denies any PND or orthopnea, but has noticed increasing abdominal girth over the past one month. Unfortunately, the patient continues to drink alcohol. He drinks 6-8 x 12 oz beers anywhere from 4-7 days of the week. He denies any illicit drug use. He has been treated Cough for nearly 40 years. He complains of a nonproductive cough without hemoptysis, sore throat, dizziness. He quit smoking 20 years ago after approximately 40-pack-year history. He last drank alcohol on 02/18/2016.  In the emergency room, the patient was afebrile and hemodynamically stable saturating 96% on room air. WBC was 4.1 with platelets 67,000. BMP was unremarkable. Hepatic panel showed AST 67, ALT 36, alkaline phosphatase is 151, total bilirubin 8.4. Urinalysis was negative for pyuria.    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 Diabetes mellitus without complication (HCC)   . Elevated liver enzymes   . Obesity 05/10/2013   Past Surgical History:  Procedure Laterality Date  . HEMORROIDECTOMY     Social History:  reports that he has never smoked. He has never used smokeless tobacco. He reports that he drinks alcohol. He reports that he does not use drugs.   No family history on file.   Allergies  Allergen Reactions  . Morphine And Related     Nausea/sick on stomach     Prior to Admission medications   Medication Sig Start Date End Date Taking? Authorizing Provider  aspirin EC 325 MG tablet Take 1 tablet by mouth daily. 08/27/14  Yes Historical Provider, MD  esomeprazole (NEXIUM) 40 MG capsule Take 1 capsule by mouth daily.   Yes Historical Provider, MD  glyBURIDE (DIABETA) 5 MG tablet Take 5 mg by mouth 2 (two) times daily  with a meal.   Yes Historical Provider, MD  metFORMIN (GLUCOPHAGE) 500 MG tablet Take 500 mg by mouth 2 (two) times daily with a meal.   Yes Historical Provider, MD     Review of Systems:  Constitutional:  No weight loss, night sweats, Fevers, chills, fatigue.  Head&Eyes: No headache.  No vision loss.  No eye pain or scotoma ENT:  No Difficulty swallowing,Tooth/dental problems,Sore throat,  No ear ache, post nasal drip,  Cardio-vascular:  No chest pain, Orthopnea, PND, swelling in lower extremities,  dizziness, palpitations  GI:  No  abdominal pain, nausea, vomiting, diarrhea, loss of appetite, hematochezia, melena, heartburn, indigestion, Resp:  No coughing up of blood .No wheezing.No chest wall deformity  Skin:  no rash or lesions.  GU:  no dysuria, change in color of urine, no urgency or frequency. No flank pain. He has scrotal pain and edema Musculoskeletal:  No joint pain or swelling. No decreased range of motion. No back pain.  Psych:  No change in mood or affect. No depression or anxiety. Neurologic: No headache, no dysesthesia, no focal weakness, no vision loss. No syncope  Physical Exam: Vitals:   02/19/16 0956 02/19/16 0957 02/19/16 1220  BP: 170/80  164/77  Pulse: 109  105  Resp: 20  18  Temp: 98.1 F (36.7 C)    SpO2: 95%  96%  Weight:  113.4 kg (250 lb)   Height:  6\' 2"  (1.88 m)    General:  A&O x 3, NAD, nontoxic, pleasant/cooperative Head/Eye: No conjunctival hemorrhage, no icterus, [place]/AT, No nystagmus ENT:  positive icterus,  No thrush, good dentition, no pharyngeal exudate Neck:  No masses, no lymphadenpathy, no bruits CV:  RRR, no rub, no gallop, no S3 Lung: Diminished breath sounds bilateral. No wheezing.  Abdomen: soft/NT, +BS, nondistended, no peritoneal signs; + Ascites Ext: No cyanosis, No rashes, No petechiae, No lymphangitis, 3+ LE edema; venous stasis dermatitis changes left greater than right leg.  Neuro: CNII-XII intact, strength 4/5 in bilateral upper and lower extremities, no dysmetria  Labs on Admission:  Basic Metabolic Panel:  Recent Labs Lab 02/19/16 1222  NA 136  K 3.7  CL 105  CO2 26   GLUCOSE 83  BUN 7  CREATININE 0.48*  CALCIUM 8.3*   Liver Function Tests:  Recent Labs Lab 02/19/16 1222  AST 67*  ALT 36  ALKPHOS 151*  BILITOT 8.4*  PROT 7.4  ALBUMIN 2.1*   No results for input(s): LIPASE, AMYLASE in the last 168 hours. No results for input(s): AMMONIA in the last 168 hours. CBC:  Recent Labs Lab 02/19/16 1222  WBC 4.1  NEUTROABS 2.4  HGB 12.2*  HCT 36.4*  MCV 102.2*  PLT 67*   Coagulation Profile: No results for input(s): INR, PROTIME in the last 168 hours. Cardiac Enzymes: No results for input(s): CKTOTAL, CKMB, CKMBINDEX, TROPONINI in the last 168 hours. BNP: Invalid input(s): POCBNP CBG: No results for input(s): GLUCAP in the last 168 hours. Urine analysis:    Component Value Date/Time   COLORURINE AMBER (A) 02/19/2016 1035   APPEARANCEUR CLEAR 02/19/2016 1035   LABSPEC >1.030 (H) 02/19/2016 1035   PHURINE 5.5 02/19/2016 1035   GLUCOSEU 100 (A) 02/19/2016 1035   HGBUR NEGATIVE 02/19/2016 1035   BILIRUBINUR MODERATE (A) 02/19/2016 1035   KETONESUR TRACE (A) 02/19/2016 1035   PROTEINUR TRACE (A) 02/19/2016 1035   NITRITE NEGATIVE 02/19/2016 1035   LEUKOCYTESUR NEGATIVE 02/19/2016 1035   Sepsis Labs: @LABRCNTIP (procalcitonin:4,lacticidven:4) ) Recent Results (from  the past 240 hour(s))  Culture, blood (Routine X 2) w Reflex to ID Panel     Status: None (Preliminary result)   Collection Time: 02/19/16 11:09 AM  Result Value Ref Range Status   Specimen Description BLOOD LEFT ARM  Final   Special Requests BOTTLES DRAWN AEROBIC AND ANAEROBIC 8CC  Final   Culture PENDING  Incomplete   Report Status PENDING  Incomplete  Culture, blood (Routine X 2) w Reflex to ID Panel     Status: None (Preliminary result)   Collection Time: 02/19/16 11:09 AM  Result Value Ref Range Status   Specimen Description BLOOD RIGHT ARM  Final   Special Requests BOTTLES DRAWN AEROBIC AND ANAEROBIC 6CC  Final   Culture PENDING  Incomplete   Report Status  PENDING  Incomplete     Radiological Exams on Admission: No results found.      Time spent:60 minutes Code Status:  FULL Family Communication:  No Family at bedside Disposition Plan: expect 2-3 day hospitalization Consults called: none DVT Prophylaxis:  SCDs  Vallie Teters, [place], DO  [facility] Pager [phone]  If 7PM-7AM, please contact night-coverage [email or web address] Password [email or web address] 02/19/2016, 2:32 PM

## 2016-02-19 NOTE — ED Provider Notes (Signed)
[facility]-EMERGENCY DEPT Provider Note   CSN: [id number] Arrival date & time: 02/19/16  0913     History   Chief Complaint Chief Complaint  Patient presents with  . Groin Swelling    HPI [name] is a 56 y.o. male.  The history is provided by the patient. No language interpreter was used.  Testicle Pain  This is a new problem. The current episode started more than 2 days ago. The problem occurs constantly. The problem has been gradually worsening. Associated symptoms include shortness of breath. Pertinent negatives include no chest pain. Nothing aggravates the symptoms. Nothing relieves the symptoms. He has tried nothing for the symptoms. The treatment provided no relief.   Pt complains of swelling to his scrotum.   Pt noticed itching and now swelling.  Pt reports he is having trouble finding his penis to pee due to the swelling.  Pt denies fever.  Pt reports swelling in his legs. Past Medical History:  Diagnosis Date  . Diabetes mellitus without complication (HCC)   . Elevated liver enzymes   . Obesity 05/10/2013    Patient Active Problem List   Diagnosis Date Noted  . Abdominal pain, RUQ (right upper quadrant) 05/10/2013  . Chest pain 05/10/2013  . Diabetes (HCC) 05/10/2013  . Obesity 05/10/2013  . Alcohol abuse 05/10/2013  . RUQ abdominal pain 05/10/2013    Past Surgical History:  Procedure Laterality Date  . HEMORROIDECTOMY         Home Medications    Prior to Admission medications   Medication Sig Start Date End Date Taking? Authorizing Provider  aspirin EC 325 MG tablet Take 1 tablet by mouth daily. 08/27/14  Yes Historical Provider, MD  esomeprazole (NEXIUM) 40 MG capsule Take 1 capsule by mouth daily.   Yes Historical Provider, MD  glyBURIDE (DIABETA) 5 MG tablet Take 5 mg by mouth 2 (two) times daily with a meal.   Yes Historical Provider, MD  metFORMIN (GLUCOPHAGE) 500 MG tablet Take 500 mg by mouth 2 (two) times daily with a meal.   Yes Historical  Provider, MD    Family History No family history on file.  Social History Social History  Substance Use Topics  . Smoking status: Never Smoker  . Smokeless tobacco: Never Used  . Alcohol use Yes     Comment: daily     Allergies   Morphine and related   Review of Systems Review of Systems  Respiratory: Positive for shortness of breath.   Cardiovascular: Negative for chest pain.  Genitourinary: Positive for testicular pain.  All other systems reviewed and are negative.    Physical Exam Updated Vital Signs BP 164/77   Pulse 105   Temp 98.1 F (36.7 C)   Resp 18   Ht 6\' 2"  (1.88 m)   Wt 113.4 kg   SpO2 96%   BMI 32.10 kg/m   Physical Exam  Constitutional: He appears well-developed and well-nourished.  HENT:  Head: Normocephalic and atraumatic.  Eyes: Conjunctivae are normal.  Neck: Neck supple.  Cardiovascular: Normal rate and regular rhythm.   No murmur heard. Pulmonary/Chest: Effort normal and breath sounds normal. No respiratory distress.  Abdominal: Soft. There is no tenderness.  Genitourinary:  Genitourinary Comments: Red swollen scrotum,  Demarcation inner thighs.  Tender to touch.  Penis no lesions    Musculoskeletal: He exhibits no edema.  Neurological: He is alert.  Skin: Skin is warm and dry.  Psychiatric: He has a normal mood and affect.  Nursing  note and vitals reviewed.    ED Treatments / Results  Labs (all labs ordered are listed, but only abnormal results are displayed) Labs Reviewed  URINALYSIS, ROUTINE W REFLEX MICROSCOPIC - Abnormal; Notable for the following:       Result Value   Color, Urine AMBER (*)    Specific Gravity, Urine >1.030 (*)    Glucose, UA 100 (*)    Bilirubin Urine MODERATE (*)    Ketones, ur TRACE (*)    Protein, ur TRACE (*)    All other components within normal limits  URINALYSIS, MICROSCOPIC (REFLEX) - Abnormal; Notable for the following:    Bacteria, UA MANY (*)    All other components within normal  limits  CBC WITH DIFFERENTIAL/PLATELET - Abnormal; Notable for the following:    RBC 3.56 (*)    Hemoglobin 12.2 (*)    HCT 36.4 (*)    MCV 102.2 (*)    MCH 34.3 (*)    RDW 17.0 (*)    Platelets 67 (*)    All other components within normal limits  COMPREHENSIVE METABOLIC PANEL - Abnormal; Notable for the following:    Creatinine, Ser 0.48 (*)    Calcium 8.3 (*)    Albumin 2.1 (*)    AST 67 (*)    Alkaline Phosphatase 151 (*)    Total Bilirubin 8.4 (*)    All other components within normal limits  CULTURE, BLOOD (ROUTINE X 2)  CULTURE, BLOOD (ROUTINE X 2)    EKG  EKG Interpretation None     Dr. Particia NearingHaviland in to see and examine. Labs obtasined.  Pt given vancomycin IV.  Hospitalist consulted to admit  Radiology No results found.  Procedures Procedures (including critical care time)  Medications Ordered in ED Medications  vancomycin (VANCOCIN) IVPB 1000 mg/200 mL premix (1,000 mg Intravenous New Bag/Given 02/19/16 1238)     Initial Impression / Assessment and Plan / ED Course  I have reviewed the triage vital signs and the nursing notes.  Pertinent labs & imaging results that were available during my care of the patient were reviewed by me and considered in my medical decision making (see chart for details).       Final Clinical Impressions(s) / ED Diagnoses   Final diagnoses:  Cellulitis, scrotum    New Prescriptions New Prescriptions   No medications on file     Elson AreasLeslie K Sache Sane, PA-C 02/19/16 1345    Jacalyn LefevreJulie Haviland, MD 02/19/16 1416

## 2016-02-19 NOTE — Progress Notes (Signed)
Pharmacy Antibiotic Note  [name] is a 56 y.o. male admitted on 02/19/2016 with cellulitis/ wound infection.  Pharmacy has been consulted for Vancomycin dosing.  Plan: Vancomycin 1gm given in ED, give another 1gm IV now then 1gm IV q12h. Goal trough is 10-6115mcg/ml F/U cultures and clinical progress Monitor V/S and labs, levels as indicated  Height: 6\' 2"  (188 cm) Weight: 250 lb (113.4 kg) IBW/kg (Calculated) : 82.2  Temp (24hrs), Avg:98.1 F (36.7 C), Min:98.1 F (36.7 C), Max:98.1 F (36.7 C)   Recent Labs Lab 02/19/16 1222  WBC 4.1  CREATININE 0.48*    Estimated Creatinine Clearance: 141.4 mL/min (by C-G formula based on SCr of 0.48 mg/dL (L)).    Allergies  Allergen Reactions  . Morphine And Related     Nausea/sick on stomach    Antimicrobials this admission: Vancomycin 2/5 >>    Dose adjustments this admission: N/A  Microbiology results: 2/5 BCx: pending  Thank you for allowing pharmacy to be a part of this patient's care. [name], BS [name], [name]  Tera Materoole, Zarielle Cea L 02/19/2016 4:08 PM

## 2016-02-19 NOTE — ED Triage Notes (Signed)
Onset 3 days ago scrotum swelling, no difficulty voiding, has developed rash causing pain

## 2016-02-19 NOTE — ED Notes (Signed)
Report given to 2A nurse.

## 2016-02-20 ENCOUNTER — Inpatient Hospital Stay (HOSPITAL_COMMUNITY): Payer: Self-pay

## 2016-02-20 DIAGNOSIS — I509 Heart failure, unspecified: Secondary | ICD-10-CM

## 2016-02-20 DIAGNOSIS — R188 Other ascites: Secondary | ICD-10-CM

## 2016-02-20 DIAGNOSIS — R0600 Dyspnea, unspecified: Secondary | ICD-10-CM

## 2016-02-20 LAB — COMPREHENSIVE METABOLIC PANEL
ALBUMIN: 1.9 g/dL — AB (ref 3.5–5.0)
ALT: 32 U/L (ref 17–63)
AST: 59 U/L — AB (ref 15–41)
Alkaline Phosphatase: 152 U/L — ABNORMAL HIGH (ref 38–126)
Anion gap: 4 — ABNORMAL LOW (ref 5–15)
BILIRUBIN TOTAL: 7.7 mg/dL — AB (ref 0.3–1.2)
BUN: 7 mg/dL (ref 6–20)
CO2: 27 mmol/L (ref 22–32)
CREATININE: 0.45 mg/dL — AB (ref 0.61–1.24)
Calcium: 8 mg/dL — ABNORMAL LOW (ref 8.9–10.3)
Chloride: 105 mmol/L (ref 101–111)
GFR calc Af Amer: >60 mL/min (ref >60)
GFR calc non Af Amer: >60 mL/min (ref >60)
GLUCOSE: 123 mg/dL — AB (ref 65–99)
POTASSIUM: 3.5 mmol/L (ref 3.5–5.1)
Sodium: 136 mmol/L (ref 135–145)
TOTAL PROTEIN: 6.6 g/dL (ref 6.5–8.1)

## 2016-02-20 LAB — GLUCOSE, CAPILLARY
GLUCOSE-CAPILLARY: 164 mg/dL — AB (ref 65–99)
Glucose-Capillary: 119 mg/dL — ABNORMAL HIGH (ref 65–99)
Glucose-Capillary: 176 mg/dL — ABNORMAL HIGH (ref 65–99)
Glucose-Capillary: 169 mg/dL — ABNORMAL HIGH (ref 65–99)

## 2016-02-20 LAB — CBC
HEMATOCRIT: 33.9 % — AB (ref 39.0–52.0)
Hemoglobin: 11.5 g/dL — ABNORMAL LOW (ref 13.0–17.0)
MCH: 34.7 pg — ABNORMAL HIGH (ref 26.0–34.0)
MCHC: 33.9 g/dL (ref 30.0–36.0)
MCV: 102.4 fL — AB (ref 78.0–100.0)
Platelets: 64 10*3/uL — ABNORMAL LOW (ref 150–400)
RBC: 3.31 MIL/uL — ABNORMAL LOW (ref 4.22–5.81)
RDW: 17.3 % — AB (ref 11.5–15.5)
WBC: 4.4 10*3/uL (ref 4.0–10.5)

## 2016-02-20 LAB — ECHOCARDIOGRAM COMPLETE
AO mean calculated velocity dopler: 122 cm/s
AOPV: 0.77 m/s
AV Area VTI index: 0.9 cm2/m2
AV Area mean vel: 2.25 cm2
AV VEL mean LVOT/AV: 0.72
AV peak Index: 0.95
AV vel: 2.27
AVA: 2.27 cm2
AVAREAMEANVIN: 0.89 cm2/m2
AVAREAVTI: 2.42 cm2
AVG: 7 mmHg
AVLVOTPG: 9 mmHg
AVPG: 16 mmHg
AVPKVEL: 197 cm/s
CHL CUP RV SYS PRESS: 32 mmHg
CHL CUP TV REG PEAK VELOCITY: 267 cm/s
E decel time: 309 msec
E/e' ratio: 9.06
FS: 33 % (ref 28–44)
HEIGHTINCHES: 74 in
IV/PV OW: 1.1
LA ID, A-P, ES: 37 mm
LA diam end sys: 37 mm
LA vol A4C: 62 ml
LA vol: 63.1 mL
LADIAMINDEX: 1.46 cm/m2
LAVOLIN: 24.9 mL/m2
LV SIMPSON'S DISK: 61
LV TDI E'MEDIAL: 6.53
LV e' LATERAL: 12.8 cm/s
LVDIAVOL: 101 mL (ref 62–150)
LVDIAVOLIN: 40 mL/m2
LVEEAVG: 9.06
LVEEMED: 9.06
LVOT VTI: 32.1 cm
LVOT area: 3.14 cm2
LVOT diameter: 20 mm
LVOT peak vel: 152 cm/s
LVOTSV: 101 mL
LVOTVTI: 0.72 cm
LVSYSVOL: 39 mL (ref 21–61)
LVSYSVOLIN: 15 mL/m2
Lateral S' vel: 19.6 cm/s
MV Dec: 309
MV pk E vel: 116 m/s
MVPG: 5 mmHg
MVPKAVEL: 137 m/s
PW: 11.4 mm — AB (ref 0.6–1.1)
RV TAPSE: 30.4 mm
Stroke v: 62 ml
TDI e' lateral: 12.8
TR max vel: 267 cm/s
VTI: 44.4 cm
Valve area index: 0.9
WEIGHTICAEL: 4220.8 [oz_av]

## 2016-02-20 LAB — PROCALCITONIN: Procalcitonin: <0.1 ng/mL

## 2016-02-20 LAB — VITAMIN B12: Vitamin B-12: 2003 pg/mL — ABNORMAL HIGH (ref 180–914)

## 2016-02-20 LAB — BRAIN NATRIURETIC PEPTIDE: B NATRIURETIC PEPTIDE 5: 56 pg/mL (ref 0.0–100.0)

## 2016-02-20 MED ORDER — PNEUMOCOCCAL VAC POLYVALENT 25 MCG/0.5ML IJ INJ
0.5000 mL | INJECTION | INTRAMUSCULAR | Status: DC
  Filled 2016-02-20 (×2): qty 0.5

## 2016-02-20 MED ORDER — FUROSEMIDE 10 MG/ML IJ SOLN
40.0000 mg | Freq: Two times a day (BID) | INTRAMUSCULAR | Status: DC
  Administered 2016-02-20 – 2016-02-25 (×11): 40 mg via INTRAVENOUS
  Filled 2016-02-20 (×11): qty 4

## 2016-02-20 NOTE — Progress Notes (Addendum)
PROGRESS NOTE  Gwenyth BenderDavid W Koffler [id number] DOB: October 08, 1960 DOA: 02/19/2016 [facility]  Brief History:  56 y.o. male with medical history of diabetes mellitus, hypertension, stroke, alcohol abuse presented with a 5 day history of scrotal erythema and edema and pain. The patient first noticed erythema on his scrotum on 02/14/2016. On 02/16/2016, the patient began noticing increasing edema of his scrotum to the point of encompassing his penis. He has some subjective chills without fevers. He denied any nausea, vomiting, diarrhea, dysuria. However, he came to the emergency department on 02/19/2016 because he was having difficulty urinating because of progressive edema. In addition, the patient has been complaining of shortness of breath for the past 14 months which has worsened significantly over the past 2 weeks. He states that he is having difficulty walking from his bedroom to the bathroom without shortness of breath. He has noted increasing abdominal girth.   Unfortunately, the patient continues to drink alcohol. He drinks 6-8 x 12 oz beers anywhere from 4-7 days of the week. He denies any illicit drug use. He quit smoking 20 years ago after approximately 40-pack-year history. He last drank alcohol on 02/18/2016.   Assessment/Plan: Acute CHF -suspect R-side CHF/Cor Pulmonale -Echo -start IV lasix -daily weights -accurate I/O -clinically fluid overloaded with significant JVD -daily BMP  Scrotum cellulitis  -Continue vancomycin-->improving erythema -Scrotal ultrasound--negative for epididymitis, orchitis, or torsion  Alcohol abuse with presumptive liver cirrhosis -Request paracentesis--not enough ascites to perform safely -Check alpha-fetoprotein--pending -Alcohol withdrawal protocol  Lower extremity edema and pain -venous duplex--neg -urine protein creatinine ratio--not enough protein to perform -likely related to his cirrhosis, hypoalbuminemia, and  CHF  LLL infiltrate -personally reviewed Chest x-ray--LLL opacity, vascular congestion -do not feel he has clinical syndrome consistent with pneumonia--will not start abx -Echocardiogram -stable on RA presently -check procalcitonin  Thrombocytopenia -Serum B12--pending -HIV--pending -Likely related to the patient's alcoholic liver disease/cirrhosis  Hyperbilirubinemia -secondary to underlying cirrhosis -08/26/2014 hepatitis B surface, hepatitis C antibody negative  Diabetes mellitus type 2 -Hemoglobin A1c -NovoLog sliding scale  Hypertension -The patient is not on any antihypertensive medications presently -Monitor clinically -Hydralazine when necessary SBP >180   Disposition Plan:   Home in 3-4 days  Family Communication:  No Family at bedside  Consultants:  none  Code Status:  FULL--confirmed with pt  DVT Prophylaxis:  SCDs   Procedures: As Listed in Progress Note Above  Antibiotics: None    Subjective: Patient with shortness of breath. He denies fevers, chills, headache, neck pain, chest pain, nausea, vomiting, diarrhea, abdominal pain. No dysuria or hematuria. No hematochezia or melena.  Objective: Vitals:   02/19/16 1530 02/19/16 1556 02/19/16 2106 02/20/16 0500  BP: 153/81 (!) 159/68 (!) 155/72 (!) 153/74  Pulse: 99 (!) 101 97 99  Resp: 18  18 18   Temp:  98.1 F (36.7 C) 97.9 F (36.6 C) 98.1 F (36.7 C)  TempSrc:  Oral Oral Oral  SpO2: 93% 96% 95% 96%  Weight:      Height:  6\' 2"  (1.88 m)      Intake/Output Summary (Last 24 hours) at 02/20/16 0954 Last data filed at 02/20/16 0919  Gross per 24 hour  Intake              880 ml  Output              250 ml  Net  630 ml   Weight change:  Exam:   General:  Pt is alert, follows commands appropriately, not in acute distress  HEENT: No icterus, No thrush, No neck mass, [place]/AT  Cardiovascular: RRR, S1/S2, no rubs, no gallops+JVD  Respiratory: Bibasilar crackles. No  wheezing. Good air movement.  Abdomen: Soft/+BS, non tender, non distended, no guarding  Extremities: 2 + LE edema, No lymphangitis, No petechiae, No rashes, no synovitis   Data Reviewed: I have personally reviewed following labs and imaging studies Basic Metabolic Panel:  Recent Labs Lab 02/19/16 1222 02/20/16 0537  NA 136 136  K 3.7 3.5  CL 105 105  CO2 26 27  GLUCOSE 83 123*  BUN 7 7  CREATININE 0.48* 0.45*  CALCIUM 8.3* 8.0*   Liver Function Tests:  Recent Labs Lab 02/19/16 1222 02/20/16 0537  AST 67* 59*  ALT 36 32  ALKPHOS 151* 152*  BILITOT 8.4* 7.7*  PROT 7.4 6.6  ALBUMIN 2.1* 1.9*   No results for input(s): LIPASE, AMYLASE in the last 168 hours. No results for input(s): AMMONIA in the last 168 hours. Coagulation Profile:  Recent Labs Lab 02/19/16 2027  INR 2.00   CBC:  Recent Labs Lab 02/19/16 1222 02/20/16 0537  WBC 4.1 4.4  NEUTROABS 2.4  --   HGB 12.2* 11.5*  HCT 36.4* 33.9*  MCV 102.2* 102.4*  PLT 67* 64*   Cardiac Enzymes: No results for input(s): CKTOTAL, CKMB, CKMBINDEX, TROPONINI in the last 168 hours. BNP: Invalid input(s): POCBNP CBG:  Recent Labs Lab 02/19/16 1719 02/19/16 2119 02/20/16 0729  GLUCAP 78 182* 119*   HbA1C: No results for input(s): HGBA1C in the last 72 hours. Urine analysis:    Component Value Date/Time   COLORURINE AMBER (A) 02/19/2016 1035   APPEARANCEUR CLEAR 02/19/2016 1035   LABSPEC >1.030 (H) 02/19/2016 1035   PHURINE 5.5 02/19/2016 1035   GLUCOSEU 100 (A) 02/19/2016 1035   HGBUR NEGATIVE 02/19/2016 1035   BILIRUBINUR MODERATE (A) 02/19/2016 1035   KETONESUR TRACE (A) 02/19/2016 1035   PROTEINUR TRACE (A) 02/19/2016 1035   NITRITE NEGATIVE 02/19/2016 1035   LEUKOCYTESUR NEGATIVE 02/19/2016 1035   Sepsis Labs: @LABRCNTIP (procalcitonin:4,lacticidven:4) ) Recent Results (from the past 240 hour(s))  Culture, blood (Routine X 2) w Reflex to ID Panel     Status: None (Preliminary result)    Collection Time: 02/19/16 11:09 AM  Result Value Ref Range Status   Specimen Description BLOOD LEFT ARM  Final   Special Requests BOTTLES DRAWN AEROBIC AND ANAEROBIC 8CC  Final   Culture PENDING  Incomplete   Report Status PENDING  Incomplete  Culture, blood (Routine X 2) w Reflex to ID Panel     Status: None (Preliminary result)   Collection Time: 02/19/16 11:09 AM  Result Value Ref Range Status   Specimen Description BLOOD RIGHT ARM  Final   Special Requests BOTTLES DRAWN AEROBIC AND ANAEROBIC 6CC  Final   Culture PENDING  Incomplete   Report Status PENDING  Incomplete     Scheduled Meds: . folic acid  1 mg Oral Daily  . furosemide  40 mg Intravenous BID  . insulin aspart  0-9 Units Subcutaneous TID WC  . multivitamin with minerals  1 tablet Oral Daily  . pantoprazole  80 mg Oral Q1200  . [START ON 02/21/2016] pneumococcal 23 valent vaccine  0.5 mL Intramuscular Tomorrow-1000  . thiamine  100 mg Oral Daily   Or  . thiamine  100 mg Intravenous Daily  . vancomycin  1,000 mg Intravenous Q12H   Continuous Infusions:  Procedures/Studies: Dg Chest 2 View  Result Date: 02/19/2016 CLINICAL DATA:  56 year old with three-day history of scrotal swelling. Chronic 1 year history of shortness of breath that has acutely worsened recently. Current smoker and current history of diabetes. EXAM: CHEST  2 VIEW COMPARISON:  02/19/2016, 05/10/2013 and earlier including CTA chest 05/10/2013 and CT chest 01/11/2013. FINDINGS: Cardiac silhouette upper normal in size, unchanged. Hilar and mediastinal contours otherwise unremarkable. Airspace consolidation involving the left lower lobe and associated small left pleural effusion. Lungs otherwise clear. Mild pulmonary venous hypertension without overt edema. Degenerative changes involving the thoracic spine. IMPRESSION: Acute left lower lobe pneumonia and associated small left pleural effusion. Electronically Signed   By: Hulan Saas M.D.   On: 02/19/2016  17:21   US Scrotum  Result Date: 02/19/2016 CLINICAL DATA:  Scrotal swelling for 4 weeks. EXAM: SCROTAL ULTRASOUND DOPPLER ULTRASOUND OF THE TESTICLES TECHNIQUE: Complete ultrasound examination of the testicles, epididymis, and other scrotal structures was performed. Color and spectral Doppler ultrasound were also utilized to evaluate blood flow to the testicles. COMPARISON:  None. FINDINGS: Right testicle Measurements: 4.0 x 2.7 x 2.6 cm. No mass or microlithiasis visualized. Left testicle Measurements: 4.1 x 2.7 x 2.9 cm. No mass or microlithiasis visualized. Right epididymis:  Normal in size and appearance. Left epididymis:  Normal in size and appearance. Hydrocele:  Moderate bilateral hydroceles. Varicocele:  None visualized. Pulsed Doppler interrogation of both testes demonstrates normal low resistance arterial and venous waveforms bilaterally. There is significant scrotal skin thickening/edema. No soft tissue fluid collection to suggest an abscess. IMPRESSION: 1. No testicular mass or evidence of torsion. No evidence of epididymitis/orchitis. 2. Moderate bilateral hydroceles. Significant scrotal skin thickening consistent with diffuse edema. Electronically Signed   By: Amie Portland M.D.   On: 02/19/2016 17:19   US Abdomen Limited  Result Date: 02/19/2016 CLINICAL DATA:  Abdominal swelling question ascites, dyspnea, diabetes mellitus EXAM: LIMITED ABDOMEN ULTRASOUND FOR ASCITES TECHNIQUE: Limited ultrasound survey for ascites was performed in all four abdominal quadrants. COMPARISON:  05/10/2013 FINDINGS: Small amounts of ascites are identified in the RIGHT upper and RIGHT lower quadrants. Slightly irregular nodular appearance of liver suggesting cirrhosis. Spleen appears enlarged. IMPRESSION: Small amount of ascites in the RIGHT abdomen Electronically Signed   By: Ulyses Southward M.D.   On: 02/19/2016 17:16   US Venous Img Lower Bilateral  Result Date: 02/19/2016 CLINICAL DATA:  Bilateral lower extremity  edema, pain and erythema. EXAM: BILATERAL LOWER EXTREMITY VENOUS DOPPLER ULTRASOUND TECHNIQUE: Gray-scale sonography with graded compression, as well as color Doppler and duplex ultrasound were performed to evaluate the lower extremity deep venous systems from the level of the common femoral vein and including the common femoral, femoral, profunda femoral, popliteal and calf veins including the posterior tibial, peroneal and gastrocnemius veins when visible. The superficial great saphenous vein was also interrogated. Spectral Doppler was utilized to evaluate flow at rest and with distal augmentation maneuvers in the common femoral, femoral and popliteal veins. COMPARISON:  None. FINDINGS: RIGHT LOWER EXTREMITY Common Femoral Vein: No evidence of thrombus. Normal compressibility, respiratory phasicity and response to augmentation. Saphenofemoral Junction: No evidence of thrombus. Normal compressibility and flow on color Doppler imaging. Profunda Femoral Vein: No evidence of thrombus. Normal compressibility and flow on color Doppler imaging. Femoral Vein: No evidence of thrombus. Normal compressibility, respiratory phasicity and response to augmentation. Popliteal Vein: No evidence of thrombus. Normal compressibility, respiratory phasicity and response to augmentation. Calf Veins:  No evidence of thrombus. Normal compressibility and flow on color Doppler imaging. Superficial Great Saphenous Vein: No evidence of thrombus. Normal compressibility and flow on color Doppler imaging. Venous Reflux:  None. Other Findings: No evidence of superficial thrombophlebitis or abnormal fluid collection. LEFT LOWER EXTREMITY Common Femoral Vein: No evidence of thrombus. Normal compressibility, respiratory phasicity and response to augmentation. Saphenofemoral Junction: No evidence of thrombus. Normal compressibility and flow on color Doppler imaging. Profunda Femoral Vein: No evidence of thrombus. Normal compressibility and flow on  color Doppler imaging. Femoral Vein: No evidence of thrombus. Normal compressibility, respiratory phasicity and response to augmentation. Popliteal Vein: No evidence of thrombus. Normal compressibility, respiratory phasicity and response to augmentation. Calf Veins: No evidence of thrombus. Normal compressibility and flow on color Doppler imaging. Superficial Great Saphenous Vein: No evidence of thrombus. Normal compressibility and flow on color Doppler imaging. Venous Reflux:  None. Other Findings: No evidence of superficial thrombophlebitis or abnormal fluid collection. IMPRESSION: No evidence of bilateral lower extremity deep venous thrombosis. Electronically Signed   By: Irish Lack M.D.   On: 02/19/2016 17:27   [place] Art/ven Flow Abd Pelv Doppler  Result Date: 02/19/2016 CLINICAL DATA:  Scrotal swelling for 4 weeks. EXAM: SCROTAL ULTRASOUND DOPPLER ULTRASOUND OF THE TESTICLES TECHNIQUE: Complete ultrasound examination of the testicles, epididymis, and other scrotal structures was performed. Color and spectral Doppler ultrasound were also utilized to evaluate blood flow to the testicles. COMPARISON:  None. FINDINGS: Right testicle Measurements: 4.0 x 2.7 x 2.6 cm. No mass or microlithiasis visualized. Left testicle Measurements: 4.1 x 2.7 x 2.9 cm. No mass or microlithiasis visualized. Right epididymis:  Normal in size and appearance. Left epididymis:  Normal in size and appearance. Hydrocele:  Moderate bilateral hydroceles. Varicocele:  None visualized. Pulsed Doppler interrogation of both testes demonstrates normal low resistance arterial and venous waveforms bilaterally. There is significant scrotal skin thickening/edema. No soft tissue fluid collection to suggest an abscess. IMPRESSION: 1. No testicular mass or evidence of torsion. No evidence of epididymitis/orchitis. 2. Moderate bilateral hydroceles. Significant scrotal skin thickening consistent with diffuse edema. Electronically Signed   By: Amie Portland M.D.   On: 02/19/2016 17:19    Sholanda Croson, [place], DO  [facility] Pager [phone]  If 7PM-7AM, please contact night-coverage [email or web address] Password [email or web address] 02/20/2016, 9:54 AM   LOS: 1 day

## 2016-02-20 NOTE — Progress Notes (Signed)
*  PRELIMINARY RESULTS* Echocardiogram 2D Echocardiogram has been performed.  Stacey DrainWhite, Darrek Leasure J 02/20/2016, 3:26 PM

## 2016-02-21 DIAGNOSIS — K7031 Alcoholic cirrhosis of liver with ascites: Secondary | ICD-10-CM

## 2016-02-21 DIAGNOSIS — N492 Inflammatory disorders of scrotum: Principal | ICD-10-CM

## 2016-02-21 DIAGNOSIS — F1010 Alcohol abuse, uncomplicated: Secondary | ICD-10-CM

## 2016-02-21 DIAGNOSIS — D696 Thrombocytopenia, unspecified: Secondary | ICD-10-CM

## 2016-02-21 LAB — BASIC METABOLIC PANEL
Anion gap: 6 (ref 5–15)
BUN: 7 mg/dL (ref 6–20)
CHLORIDE: 105 mmol/L (ref 101–111)
CO2: 27 mmol/L (ref 22–32)
CREATININE: 0.54 mg/dL — AB (ref 0.61–1.24)
Calcium: 8.1 mg/dL — ABNORMAL LOW (ref 8.9–10.3)
GFR calc non Af Amer: >60 mL/min (ref >60)
Glucose, Bld: 94 mg/dL (ref 65–99)
Potassium: 3.1 mmol/L — ABNORMAL LOW (ref 3.5–5.1)
Sodium: 138 mmol/L (ref 135–145)

## 2016-02-21 LAB — GLUCOSE, CAPILLARY
GLUCOSE-CAPILLARY: 177 mg/dL — AB (ref 65–99)
Glucose-Capillary: 95 mg/dL (ref 65–99)
Glucose-Capillary: 152 mg/dL — ABNORMAL HIGH (ref 65–99)
Glucose-Capillary: 183 mg/dL — ABNORMAL HIGH (ref 65–99)

## 2016-02-21 LAB — CBC
HEMATOCRIT: 35.2 % — AB (ref 39.0–52.0)
Hemoglobin: 11.7 g/dL — ABNORMAL LOW (ref 13.0–17.0)
MCH: 34.1 pg — ABNORMAL HIGH (ref 26.0–34.0)
MCHC: 33.2 g/dL (ref 30.0–36.0)
MCV: 102.6 fL — ABNORMAL HIGH (ref 78.0–100.0)
Platelets: 68 10*3/uL — ABNORMAL LOW (ref 150–400)
RBC: 3.43 MIL/uL — ABNORMAL LOW (ref 4.22–5.81)
RDW: 17.5 % — AB (ref 11.5–15.5)
WBC: 4.1 10*3/uL (ref 4.0–10.5)

## 2016-02-21 LAB — MAGNESIUM: Magnesium: 1.6 mg/dL — ABNORMAL LOW (ref 1.7–2.4)

## 2016-02-21 LAB — HEMOGLOBIN A1C
HEMOGLOBIN A1C: 5 % (ref 4.8–5.6)
Mean Plasma Glucose: 97 mg/dL

## 2016-02-21 LAB — AFP TUMOR MARKER: AFP TUMOR MARKER: 3.9 ng/mL (ref 0.0–8.3)

## 2016-02-21 LAB — HIV ANTIBODY (ROUTINE TESTING W REFLEX): HIV Screen 4th Generation wRfx: NONREACTIVE
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
## 2016-02-21 NOTE — Plan of Care (Signed)
Problem: Food- and Nutrition-Related Knowledge Deficit (NB-1.1) Goal: Nutrition education Formal process to instruct or train a patient/client in a skill or to impart knowledge to help patients/clients voluntarily manage or modify food choices and eating behavior to maintain or improve health. Outcome: Adequate for Discharge Nutrition Education Note  RD consulted for nutrition education regarding low sodium diet.  RD provided "Low Sodium Nutrition Therapy" handout from the Academy of [employer] Dietetics.  Reviewed patient's dietary recall. Breakfast: Doesn't eat- skips Afternoon snack: Something simple, gave example of can of condensed soup Dinner: What his wife makes. Typical meal of Meat, starch, vegetable Beverages: Alcohol Other: Eats fast food 3-4x a week, rarely eats frozen meals (<1x a week), does not eat deli meats, hotdogs, sausage/bacon. He uses the salt shaker  Explained to patient that the goal of this diet is to reduce the amount of sodium he consumes to reduce his abdominal swelling and improve his QOL.   Discussed how his canned soup and fast food were the main reported areas that could be improved upon. Explained that 1 can of soup can provide >1500 mg of sodium by itself, as such, it is best to eat this sparingly and even better to avoid buying canned soup and instead make his own with low sodium ingredients/stock. He then says he does not eat soup every day. Fast food is also typically high in sodium, depending on what items are chosen and eating out is not ideal.   The second habit he could change was his use of the salt shaker. He says he uses garlic more than salt to season his food. RD encouraged him to rely more on his garlic and other salt free seasonings. Listed salt free seasonings such as garlic powder, onion powder, thyme, parsley, ginger etc  RD asked pt if he looked at nutritional labels. He said he did, but only at the sugar. RD stated that sodium and portion  size were other things he should start to look at. Explained how multiplying the information by the portion controlled is critical. He should aim for <2 g of sodium per day.   Summary of recommendations 1. Reduce/eliminate condensed soup. Make home made 2. Reduce fast food frequency 3. Eliminate use of salt shaker in favor of salt free seasoning 4. Begin to check sodium information on food package. Goal is <2g/day  RD discussed why it is important for patient to adhere to diet recommendations, as following this diet is in his best interest  Expect poor compliance. Pt is an alcoholic and has continued to drink despite ongoing counseling against. Hard to see him being able to follow this counsel, but ignore the etoh problem. Today, participation in the conversation was also minimal with short answers. Did not seem interested and appeared to be dozing off at times.   Body mass index is 33.82 kg/m. Pt meets criteria for obese based on current BMI.  Current diet order is Heart Healthy/Carb Mod, patient is consuming approximately 100% of meals at this time. No further nutrition interventions warranted at this time. If additional nutrition issues arise, please re-consult RD.   Christophe LouisNathan Franks RD, LDN, CNSC Clinical Nutrition Pager: [phone] 02/21/2016 3:01 PM

## 2016-02-21 NOTE — Progress Notes (Signed)
PROGRESS NOTE  [name] ZOX:[id number] DOB: January 01, 1961 DOA: 02/19/2016 PCP: [facility]  Brief History:  56 y.o. male with medical history of diabetes mellitus, hypertension, stroke, alcohol abuse presented with a 5 day history of scrotal erythema and edema and pain. The patient first noticed erythema on his scrotum on 02/14/2016. On 02/16/2016, the patient began noticing increasing edema of his scrotum to the point of encompassing his penis. He has some subjective chills without fevers. He denied any nausea, vomiting, diarrhea, dysuria. However, he came to the emergency department on 02/19/2016 because he was having difficulty urinating because of progressive edema. In addition, the patient has been complaining of shortness of breath for the past 14 months which has worsened significantly over the past 2 weeks. He states that he is having difficulty walking from his bedroom to the bathroom without shortness of breath. He has noted increasing abdominal girth.   Unfortunately, the patient continues to drink alcohol. He drinks 6-8 x 12 oz beers anywhere from 4-7 days of the week. He denies any illicit drug use. He quit smoking 20 years ago after approximately 40-pack-year history. He last drank alcohol on 02/18/2016.   Assessment/Plan: Anasarca -Suspect this is related to liver disease and hypoalbuminemia -Continue with intravenous diuresis -BNP normal, echocardiogram is largely unremarkable.  Scrotum cellulitis  -Continue vancomycin-->improving erythema -Scrotal ultrasound--negative for epididymitis, orchitis, or torsion  Alcohol abuse with presumptive liver cirrhosis -Requested paracentesis--not enough ascites to perform safely -Check alpha-fetoprotein in normal range at 3.9 -Alcohol withdrawal protocol  Lower extremity edema and pain -venous duplex--neg -urine protein creatinine ratio--not enough protein to perform -likely related to his cirrhosis,  hypoalbuminemia,  LLL infiltrate -personally reviewed Chest x-ray--LLL opacity, vascular congestion -do not feel he has clinical syndrome consistent with pneumonia--will not start abx -Echocardiogram shows normal systolic and diastolic function -stable on RA presently -check procalcitonin  Thrombocytopenia -Serum B12: 2,003 -HIV negative -Likely related to the patient's alcoholic liver disease/cirrhosis  Hyperbilirubinemia -secondary to underlying cirrhosis -08/26/2014 hepatitis B surface, hepatitis C antibody negative  Diabetes mellitus type 2 -Hemoglobin A1c 5.0 -NovoLog sliding scale  Hypertension -The patient is not on any antihypertensive medications presently -Monitor clinically -Hydralazine when necessary SBP >180   Disposition Plan:   Home in 3-4 days  Family Communication:  No Family at bedside  Consultants:  none  Code Status:  FULL--confirmed with pt  DVT Prophylaxis:  SCDs   Procedures: As Listed in Progress Note Above  Antibiotics: Vancomycin 2/5>>    Subjective: Feeling better today. No shortness of breath.  Objective: Vitals:   02/20/16 1310 02/20/16 2059 02/21/16 0509 02/21/16 1300  BP: (!) 151/80 (!) 147/94 134/72 (!) 135/59  Pulse: 100 100 97 93  Resp: 20 18 18 18   Temp: 97.7 F (36.5 C) 97.6 F (36.4 C) 98.3 F (36.8 C) 98.4 F (36.9 C)  TempSrc: Oral Oral Oral Oral  SpO2: 99% 92% 95% 96%  Weight: 119.7 kg (263 lb 12.8 oz)  119.5 kg (263 lb 7.2 oz)   Height: 6\' 2"  (1.88 m)       Intake/Output Summary (Last 24 hours) at 02/21/16 1820 Last data filed at 02/21/16 1700  Gross per 24 hour  Intake              480 ml  Output             3800 ml  Net            -  3320 ml   Weight change: 6.26 kg (13 lb 12.8 oz) Exam:   General:  Pt is alert, follows commands appropriately, not in acute distress  HEENT: No icterus, No thrush, No neck mass, [place]/AT  Cardiovascular: RRR, S1/S2, no rubs, no gallops+JVD  Respiratory:  Bibasilar crackles. No wheezing. Good air movement.  Abdomen: Soft/+BS, non tender, non distended, no guarding  Extremities: 2 + LE edema, No lymphangitis, No petechiae, No rashes, no synovitis   Data Reviewed: I have personally reviewed following labs and imaging studies Basic Metabolic Panel:  Recent Labs Lab 02/19/16 1222 02/20/16 0537 02/21/16 0444  NA 136 136 138  K 3.7 3.5 3.1*  CL 105 105 105  CO2 26 27 27   GLUCOSE 83 123* 94  BUN 7 7 7   CREATININE 0.48* 0.45* 0.54*  CALCIUM 8.3* 8.0* 8.1*  MG  --   --  1.6*   Liver Function Tests:  Recent Labs Lab 02/19/16 1222 02/20/16 0537  AST 67* 59*  ALT 36 32  ALKPHOS 151* 152*  BILITOT 8.4* 7.7*  PROT 7.4 6.6  ALBUMIN 2.1* 1.9*   No results for input(s): LIPASE, AMYLASE in the last 168 hours. No results for input(s): AMMONIA in the last 168 hours. Coagulation Profile:  Recent Labs Lab 02/19/16 2027  INR 2.00   CBC:  Recent Labs Lab 02/19/16 1222 02/20/16 0537 02/21/16 0444  WBC 4.1 4.4 4.1  NEUTROABS 2.4  --   --   HGB 12.2* 11.5* 11.7*  HCT 36.4* 33.9* 35.2*  MCV 102.2* 102.4* 102.6*  PLT 67* 64* 68*   Cardiac Enzymes: No results for input(s): CKTOTAL, CKMB, CKMBINDEX, TROPONINI in the last 168 hours. BNP: Invalid input(s): POCBNP CBG:  Recent Labs Lab 02/20/16 1632 02/20/16 2101 02/21/16 0822 02/21/16 1131 02/21/16 1622  GLUCAP 164* 169* 95 152* 177*   HbA1C:  Recent Labs  02/19/16 2027  HGBA1C 5.0   Urine analysis:    Component Value Date/Time   COLORURINE AMBER (A) 02/19/2016 1035   APPEARANCEUR CLEAR 02/19/2016 1035   LABSPEC >1.030 (H) 02/19/2016 1035   PHURINE 5.5 02/19/2016 1035   GLUCOSEU 100 (A) 02/19/2016 1035   HGBUR NEGATIVE 02/19/2016 1035   BILIRUBINUR MODERATE (A) 02/19/2016 1035   KETONESUR TRACE (A) 02/19/2016 1035   PROTEINUR TRACE (A) 02/19/2016 1035   NITRITE NEGATIVE 02/19/2016 1035   LEUKOCYTESUR NEGATIVE 02/19/2016 1035   Sepsis  Labs: @LABRCNTIP (procalcitonin:4,lacticidven:4) ) Recent Results (from the past 240 hour(s))  Culture, blood (Routine X 2) w Reflex to ID Panel     Status: None (Preliminary result)   Collection Time: 02/19/16 11:09 AM  Result Value Ref Range Status   Specimen Description BLOOD LEFT ARM  Final   Special Requests BOTTLES DRAWN AEROBIC AND ANAEROBIC 8CC  Final   Culture NO GROWTH 2 DAYS  Final   Report Status PENDING  Incomplete  Culture, blood (Routine X 2) w Reflex to ID Panel     Status: None (Preliminary result)   Collection Time: 02/19/16 11:09 AM  Result Value Ref Range Status   Specimen Description BLOOD RIGHT ARM  Final   Special Requests BOTTLES DRAWN AEROBIC AND ANAEROBIC 6CC  Final   Culture NO GROWTH 2 DAYS  Final   Report Status PENDING  Incomplete     Scheduled Meds: . folic acid  1 mg Oral Daily  . furosemide  40 mg Intravenous BID  . insulin aspart  0-9 Units Subcutaneous TID WC  . multivitamin with minerals  1 tablet  Oral Daily  . pantoprazole  80 mg Oral Q1200  . pneumococcal 23 valent vaccine  0.5 mL Intramuscular Tomorrow-1000  . thiamine  100 mg Oral Daily   Or  . thiamine  100 mg Intravenous Daily  . vancomycin  1,000 mg Intravenous Q12H   Continuous Infusions:  Procedures/Studies: Dg Chest 2 View  Result Date: 02/19/2016 CLINICAL DATA:  56 year old with three-day history of scrotal swelling. Chronic 1 year history of shortness of breath that has acutely worsened recently. Current smoker and current history of diabetes. EXAM: CHEST  2 VIEW COMPARISON:  02/19/2016, 05/10/2013 and earlier including CTA chest 05/10/2013 and CT chest 01/11/2013. FINDINGS: Cardiac silhouette upper normal in size, unchanged. Hilar and mediastinal contours otherwise unremarkable. Airspace consolidation involving the left lower lobe and associated small left pleural effusion. Lungs otherwise clear. Mild pulmonary venous hypertension without overt edema. Degenerative changes involving  the thoracic spine. IMPRESSION: Acute left lower lobe pneumonia and associated small left pleural effusion. Electronically Signed   By: Hulan Saashomas  Lawrence M.D.   On: 02/19/2016 17:21   [place] Scrotum  Result Date: 02/19/2016 CLINICAL DATA:  Scrotal swelling for 4 weeks. EXAM: SCROTAL ULTRASOUND DOPPLER ULTRASOUND OF THE TESTICLES TECHNIQUE: Complete ultrasound examination of the testicles, epididymis, and other scrotal structures was performed. Color and spectral Doppler ultrasound were also utilized to evaluate blood flow to the testicles. COMPARISON:  None. FINDINGS: Right testicle Measurements: 4.0 x 2.7 x 2.6 cm. No mass or microlithiasis visualized. Left testicle Measurements: 4.1 x 2.7 x 2.9 cm. No mass or microlithiasis visualized. Right epididymis:  Normal in size and appearance. Left epididymis:  Normal in size and appearance. Hydrocele:  Moderate bilateral hydroceles. Varicocele:  None visualized. Pulsed Doppler interrogation of both testes demonstrates normal low resistance arterial and venous waveforms bilaterally. There is significant scrotal skin thickening/edema. No soft tissue fluid collection to suggest an abscess. IMPRESSION: 1. No testicular mass or evidence of torsion. No evidence of epididymitis/orchitis. 2. Moderate bilateral hydroceles. Significant scrotal skin thickening consistent with diffuse edema. Electronically Signed   By: Amie Portlandavid  Ormond M.D.   On: 02/19/2016 17:19   [place] Abdomen Limited  Result Date: 02/20/2016 CLINICAL DATA:  56 year old male. Assess for ascites. Subsequent encounter. EXAM: US ABDOMEN LIMITED - RIGHT UPPER QUADRANT COMPARISON:  02/19/2016. FINDINGS: Four quadrant examination without findings of ascites. Patient treated with Lasix in the interim. IMPRESSION: No ascites detected. Electronically Signed   By: Lacy DuverneySteven  Olson M.D.   On: 02/20/2016 11:08   [place] Abdomen Limited  Result Date: 02/19/2016 CLINICAL DATA:  Abdominal swelling question ascites, dyspnea, diabetes  mellitus EXAM: LIMITED ABDOMEN ULTRASOUND FOR ASCITES TECHNIQUE: Limited ultrasound survey for ascites was performed in all four abdominal quadrants. COMPARISON:  05/10/2013 FINDINGS: Small amounts of ascites are identified in the RIGHT upper and RIGHT lower quadrants. Slightly irregular nodular appearance of liver suggesting cirrhosis. Spleen appears enlarged. IMPRESSION: Small amount of ascites in the RIGHT abdomen Electronically Signed   By: Ulyses SouthwardMark  Boles M.D.   On: 02/19/2016 17:16   [place] Venous Img Lower Bilateral  Result Date: 02/19/2016 CLINICAL DATA:  Bilateral lower extremity edema, pain and erythema. EXAM: BILATERAL LOWER EXTREMITY VENOUS DOPPLER ULTRASOUND TECHNIQUE: Gray-scale sonography with graded compression, as well as color Doppler and duplex ultrasound were performed to evaluate the lower extremity deep venous systems from the level of the common femoral vein and including the common femoral, femoral, profunda femoral, popliteal and calf veins including the posterior tibial, peroneal and gastrocnemius veins when visible. The superficial great saphenous  vein was also interrogated. Spectral Doppler was utilized to evaluate flow at rest and with distal augmentation maneuvers in the common femoral, femoral and popliteal veins. COMPARISON:  None. FINDINGS: RIGHT LOWER EXTREMITY Common Femoral Vein: No evidence of thrombus. Normal compressibility, respiratory phasicity and response to augmentation. Saphenofemoral Junction: No evidence of thrombus. Normal compressibility and flow on color Doppler imaging. Profunda Femoral Vein: No evidence of thrombus. Normal compressibility and flow on color Doppler imaging. Femoral Vein: No evidence of thrombus. Normal compressibility, respiratory phasicity and response to augmentation. Popliteal Vein: No evidence of thrombus. Normal compressibility, respiratory phasicity and response to augmentation. Calf Veins: No evidence of thrombus. Normal compressibility and flow  on color Doppler imaging. Superficial Great Saphenous Vein: No evidence of thrombus. Normal compressibility and flow on color Doppler imaging. Venous Reflux:  None. Other Findings: No evidence of superficial thrombophlebitis or abnormal fluid collection. LEFT LOWER EXTREMITY Common Femoral Vein: No evidence of thrombus. Normal compressibility, respiratory phasicity and response to augmentation. Saphenofemoral Junction: No evidence of thrombus. Normal compressibility and flow on color Doppler imaging. Profunda Femoral Vein: No evidence of thrombus. Normal compressibility and flow on color Doppler imaging. Femoral Vein: No evidence of thrombus. Normal compressibility, respiratory phasicity and response to augmentation. Popliteal Vein: No evidence of thrombus. Normal compressibility, respiratory phasicity and response to augmentation. Calf Veins: No evidence of thrombus. Normal compressibility and flow on color Doppler imaging. Superficial Great Saphenous Vein: No evidence of thrombus. Normal compressibility and flow on color Doppler imaging. Venous Reflux:  None. Other Findings: No evidence of superficial thrombophlebitis or abnormal fluid collection. IMPRESSION: No evidence of bilateral lower extremity deep venous thrombosis. Electronically Signed   By: Irish Lack M.D.   On: 02/19/2016 17:27   [place] Art/ven Flow Abd Pelv Doppler  Result Date: 02/19/2016 CLINICAL DATA:  Scrotal swelling for 4 weeks. EXAM: SCROTAL ULTRASOUND DOPPLER ULTRASOUND OF THE TESTICLES TECHNIQUE: Complete ultrasound examination of the testicles, epididymis, and other scrotal structures was performed. Color and spectral Doppler ultrasound were also utilized to evaluate blood flow to the testicles. COMPARISON:  None. FINDINGS: Right testicle Measurements: 4.0 x 2.7 x 2.6 cm. No mass or microlithiasis visualized. Left testicle Measurements: 4.1 x 2.7 x 2.9 cm. No mass or microlithiasis visualized. Right epididymis:  Normal in size and  appearance. Left epididymis:  Normal in size and appearance. Hydrocele:  Moderate bilateral hydroceles. Varicocele:  None visualized. Pulsed Doppler interrogation of both testes demonstrates normal low resistance arterial and venous waveforms bilaterally. There is significant scrotal skin thickening/edema. No soft tissue fluid collection to suggest an abscess. IMPRESSION: 1. No testicular mass or evidence of torsion. No evidence of epididymitis/orchitis. 2. Moderate bilateral hydroceles. Significant scrotal skin thickening consistent with diffuse edema. Electronically Signed   By: Amie Portland M.D.   On: 02/19/2016 17:19    Milliani Herrada, MD  [facility] Pager [phone]  If 7PM-7AM, please contact night-coverage [email or web address] Password [email or web address] 02/21/2016, 6:20 PM   LOS: 2 days

## 2016-02-22 LAB — PROCALCITONIN

## 2016-02-22 LAB — GLUCOSE, CAPILLARY
GLUCOSE-CAPILLARY: 154 mg/dL — AB (ref 65–99)
Glucose-Capillary: 77 mg/dL (ref 65–99)
Glucose-Capillary: 183 mg/dL — ABNORMAL HIGH (ref 65–99)
Glucose-Capillary: 180 mg/dL — ABNORMAL HIGH (ref 65–99)

## 2016-02-22 LAB — COMPREHENSIVE METABOLIC PANEL
ALBUMIN: 1.9 g/dL — AB (ref 3.5–5.0)
ALT: 32 U/L (ref 17–63)
ANION GAP: 4 — AB (ref 5–15)
AST: 54 U/L — ABNORMAL HIGH (ref 15–41)
Alkaline Phosphatase: 143 U/L — ABNORMAL HIGH (ref 38–126)
BUN: 8 mg/dL (ref 6–20)
CHLORIDE: 106 mmol/L (ref 101–111)
CO2: 28 mmol/L (ref 22–32)
Calcium: 8.1 mg/dL — ABNORMAL LOW (ref 8.9–10.3)
Creatinine, Ser: 0.5 mg/dL — ABNORMAL LOW (ref 0.61–1.24)
GFR calc Af Amer: >60 mL/min (ref >60)
Glucose, Bld: 83 mg/dL (ref 65–99)
POTASSIUM: 3 mmol/L — AB (ref 3.5–5.1)
Sodium: 138 mmol/L (ref 135–145)
Total Bilirubin: 6.7 mg/dL — ABNORMAL HIGH (ref 0.3–1.2)
Total Protein: 6.5 g/dL (ref 6.5–8.1)

## 2016-02-22 MED ORDER — NYSTATIN 100000 UNIT/GM EX POWD
Freq: Two times a day (BID) | CUTANEOUS | Status: DC
Start: 2016-02-22 — End: 2016-02-25
  Administered 2016-02-23 – 2016-02-25 (×5): via TOPICAL
  Filled 2016-02-22 (×2): qty 15

## 2016-02-22 MED ORDER — FLUCONAZOLE IN SODIUM CHLORIDE 100-0.9 MG/50ML-% IV SOLN
100.0000 mg | INTRAVENOUS | Status: DC
  Administered 2016-02-22 – 2016-02-23 (×2): 100 mg via INTRAVENOUS
  Filled 2016-02-22 (×4): qty 50

## 2016-02-22 MED ORDER — INFLUENZA VAC SPLIT QUAD 0.5 ML IM SUSY: 0.5000 mL | PREFILLED_SYRINGE | INTRAMUSCULAR | Status: DC

## 2016-02-22 MED ORDER — FLUCONAZOLE IN SODIUM CHLORIDE 200-0.9 MG/100ML-% IV SOLN
INTRAVENOUS | Status: AC
  Filled 2016-02-22: qty 100

## 2016-02-22 MED ORDER — SPIRONOLACTONE 25 MG PO TABS
25.0000 mg | ORAL_TABLET | Freq: Every day | ORAL | Status: DC
  Administered 2016-02-22 – 2016-02-25 (×4): 25 mg via ORAL
  Filled 2016-02-22 (×4): qty 1

## 2016-02-22 MED ORDER — POTASSIUM CHLORIDE CRYS ER 20 MEQ PO TBCR
40.0000 meq | EXTENDED_RELEASE_TABLET | ORAL | Status: AC
  Administered 2016-02-22 (×3): 40 meq via ORAL
  Filled 2016-02-22 (×3): qty 2

## 2016-02-22 NOTE — Progress Notes (Signed)
PROGRESS NOTE  Jesus BenderDavid W [name] ZOX:[id number] DOB: 06-Mar-1960 DOA: 02/19/2016 [facility]  Brief History:  56 y.o. male with medical history of diabetes mellitus, hypertension, stroke, alcohol abuse presented with a 5 day history of scrotal erythema and edema and pain. The patient first noticed erythema on his scrotum on 02/14/2016. On 02/16/2016, the patient began noticing increasing edema of his scrotum to the point of encompassing his penis. He has some subjective chills without fevers. He denied any nausea, vomiting, diarrhea, dysuria. However, he came to the emergency department on 02/19/2016 because he was having difficulty urinating because of progressive edema. In addition, the patient has been complaining of shortness of breath for the past 14 months which has worsened significantly over the past 2 weeks. He states that he is having difficulty walking from his bedroom to the bathroom without shortness of breath. He has noted increasing abdominal girth.   Unfortunately, the patient continues to drink alcohol. He drinks 6-8 x 12 oz beers anywhere from 4-7 days of the week. He denies any illicit drug use. He quit smoking 20 years ago after approximately 40-pack-year history. He last drank alcohol on 02/18/2016.   Assessment/Plan: Anasarca -Suspect this is related to liver disease and hypoalbuminemia -Continue with intravenous diuresis with lasix -BNP normal, echocardiogram is largely unremarkable. -volume status -7.1L -add spironolactone  Scrotum cellulitis  -Continue vancomycin-->improving erythema -Scrotal ultrasound--negative for epididymitis, orchitis, or torsion -add fluconazole for element of candida  Alcohol abuse with presumptive liver cirrhosis -Requested paracentesis--not enough ascites to perform safely -alpha-fetoprotein in normal range at 3.9 -Alcohol withdrawal protocol  Lower extremity edema and pain -venous duplex--neg -urine protein  creatinine ratio--not enough protein to perform -likely related to his cirrhosis, hypoalbuminemia,  LLL infiltrate -personally reviewed Chest x-ray--LLL opacity, vascular congestion -do not feel he has clinical syndrome consistent with pneumonia--will not start abx -Echocardiogram shows normal systolic and diastolic function -stable on RA presently -check procalcitonin  Thrombocytopenia -Serum B12: 2,003 -HIV negative -Likely related to the patient's alcoholic liver disease/cirrhosis  Hyperbilirubinemia -secondary to underlying cirrhosis -08/26/2014 hepatitis B surface, hepatitis C antibody negative  Diabetes mellitus type 2 -Hemoglobin A1c 5.0 -NovoLog sliding scale  Hypertension -The patient is not on any antihypertensive medications presently -Monitor clinically -Hydralazine when necessary SBP >180   Disposition Plan:   Home in 3-4 days  Family Communication:  No Family at bedside  Consultants:  none  Code Status:  FULL--confirmed with pt  DVT Prophylaxis:  SCDs   Procedures: As Listed in Progress Note Above  Antibiotics: Vancomycin 2/5>>    Subjective: Feels edema is improving no pain  Objective: Vitals:   02/21/16 1300 02/21/16 2117 02/22/16 0445 02/22/16 1516  BP: (!) 135/59 132/65 129/60 (!) 148/70  Pulse: 93 96 96 95  Resp: 18 20 17 18   Temp: 98.4 F (36.9 C) 98.5 F (36.9 C) 97.8 F (36.6 C) 98 F (36.7 C)  TempSrc: Oral Oral Oral Oral  SpO2: 96% 96% 95% 96%  Weight:   122.7 kg (270 lb 8 oz)   Height:        Intake/Output Summary (Last 24 hours) at 02/22/16 1755 Last data filed at 02/22/16 1600  Gross per 24 hour  Intake             1600 ml  Output             5700 ml  Net            -  4100 ml   Weight change: 3.039 kg (6 lb 11.2 oz) Exam:   General:  Pt is alert, follows commands appropriately, not in acute distress  HEENT: No icterus, No thrush, No neck mass, [place]/AT  Cardiovascular: RRR, S1/S2, no rubs, no  gallops+JVD  Respiratory: Bibasilar crackles. No wheezing. Good air movement.  Abdomen: Soft/+BS, non tender, non distended, no guarding  Extremities: 2 + LE edema, No lymphangitis, No petechiae, No rashes, no synovitis, scrotal edema improving  Erythema: persistent erythema in groin bilaterally   Data Reviewed: I have personally reviewed following labs and imaging studies Basic Metabolic Panel:  Recent Labs Lab 02/19/16 1222 02/20/16 0537 02/21/16 0444 02/22/16 0430  NA 136 136 138 138  K 3.7 3.5 3.1* 3.0*  CL 105 105 105 106  CO2 26 27 27 28   GLUCOSE 83 123* 94 83  BUN 7 7 7 8   CREATININE 0.48* 0.45* 0.54* 0.50*  CALCIUM 8.3* 8.0* 8.1* 8.1*  MG  --   --  1.6*  --    Liver Function Tests:  Recent Labs Lab 02/19/16 1222 02/20/16 0537 02/22/16 0430  AST 67* 59* 54*  ALT 36 32 32  ALKPHOS 151* 152* 143*  BILITOT 8.4* 7.7* 6.7*  PROT 7.4 6.6 6.5  ALBUMIN 2.1* 1.9* 1.9*   No results for input(s): LIPASE, AMYLASE in the last 168 hours. No results for input(s): AMMONIA in the last 168 hours. Coagulation Profile:  Recent Labs Lab 02/19/16 2027  INR 2.00   CBC:  Recent Labs Lab 02/19/16 1222 02/20/16 0537 02/21/16 0444  WBC 4.1 4.4 4.1  NEUTROABS 2.4  --   --   HGB 12.2* 11.5* 11.7*  HCT 36.4* 33.9* 35.2*  MCV 102.2* 102.4* 102.6*  PLT 67* 64* 68*   Cardiac Enzymes: No results for input(s): CKTOTAL, CKMB, CKMBINDEX, TROPONINI in the last 168 hours. BNP: Invalid input(s): POCBNP CBG:  Recent Labs Lab 02/21/16 1622 02/21/16 2111 02/22/16 0749 02/22/16 1126 02/22/16 1628  GLUCAP 177* 183* 77 154* 183*   HbA1C:  Recent Labs  02/19/16 2027  HGBA1C 5.0   Urine analysis:    Component Value Date/Time   COLORURINE AMBER (A) 02/19/2016 1035   APPEARANCEUR CLEAR 02/19/2016 1035   LABSPEC >1.030 (H) 02/19/2016 1035   PHURINE 5.5 02/19/2016 1035   GLUCOSEU 100 (A) 02/19/2016 1035   HGBUR NEGATIVE 02/19/2016 1035   BILIRUBINUR MODERATE (A)  02/19/2016 1035   KETONESUR TRACE (A) 02/19/2016 1035   PROTEINUR TRACE (A) 02/19/2016 1035   NITRITE NEGATIVE 02/19/2016 1035   LEUKOCYTESUR NEGATIVE 02/19/2016 1035   Sepsis Labs: @LABRCNTIP (procalcitonin:4,lacticidven:4) ) Recent Results (from the past 240 hour(s))  Culture, blood (Routine X 2) w Reflex to ID Panel     Status: None (Preliminary result)   Collection Time: 02/19/16 11:09 AM  Result Value Ref Range Status   Specimen Description BLOOD LEFT ARM  Final   Special Requests BOTTLES DRAWN AEROBIC AND ANAEROBIC 8CC  Final   Culture NO GROWTH 3 DAYS  Final   Report Status PENDING  Incomplete  Culture, blood (Routine X 2) w Reflex to ID Panel     Status: None (Preliminary result)   Collection Time: 02/19/16 11:09 AM  Result Value Ref Range Status   Specimen Description BLOOD RIGHT ARM  Final   Special Requests BOTTLES DRAWN AEROBIC AND ANAEROBIC 6CC  Final   Culture NO GROWTH 3 DAYS  Final   Report Status PENDING  Incomplete     Scheduled Meds: . fluconazole (DIFLUCAN)  IV  100 mg Intravenous Q24H  . folic acid  1 mg Oral Daily  . furosemide  40 mg Intravenous BID  . insulin aspart  0-9 Units Subcutaneous TID WC  . multivitamin with minerals  1 tablet Oral Daily  . nystatin   Topical BID  . pantoprazole  80 mg Oral Q1200  . pneumococcal 23 valent vaccine  0.5 mL Intramuscular Tomorrow-1000  . spironolactone  25 mg Oral Daily  . thiamine  100 mg Oral Daily   Or  . thiamine  100 mg Intravenous Daily  . vancomycin  1,000 mg Intravenous Q12H   Continuous Infusions:  Procedures/Studies: Dg Chest 2 View  Result Date: 02/19/2016 CLINICAL DATA:  56 year old with three-day history of scrotal swelling. Chronic 1 year history of shortness of breath that has acutely worsened recently. Current smoker and current history of diabetes. EXAM: CHEST  2 VIEW COMPARISON:  02/19/2016, 05/10/2013 and earlier including CTA chest 05/10/2013 and CT chest 01/11/2013. FINDINGS: Cardiac  silhouette upper normal in size, unchanged. Hilar and mediastinal contours otherwise unremarkable. Airspace consolidation involving the left lower lobe and associated small left pleural effusion. Lungs otherwise clear. Mild pulmonary venous hypertension without overt edema. Degenerative changes involving the thoracic spine. IMPRESSION: Acute left lower lobe pneumonia and associated small left pleural effusion. Electronically Signed   By: Hulan Saas M.D.   On: 02/19/2016 17:21   US Scrotum  Result Date: 02/19/2016 CLINICAL DATA:  Scrotal swelling for 4 weeks. EXAM: SCROTAL ULTRASOUND DOPPLER ULTRASOUND OF THE TESTICLES TECHNIQUE: Complete ultrasound examination of the testicles, epididymis, and other scrotal structures was performed. Color and spectral Doppler ultrasound were also utilized to evaluate blood flow to the testicles. COMPARISON:  None. FINDINGS: Right testicle Measurements: 4.0 x 2.7 x 2.6 cm. No mass or microlithiasis visualized. Left testicle Measurements: 4.1 x 2.7 x 2.9 cm. No mass or microlithiasis visualized. Right epididymis:  Normal in size and appearance. Left epididymis:  Normal in size and appearance. Hydrocele:  Moderate bilateral hydroceles. Varicocele:  None visualized. Pulsed Doppler interrogation of both testes demonstrates normal low resistance arterial and venous waveforms bilaterally. There is significant scrotal skin thickening/edema. No soft tissue fluid collection to suggest an abscess. IMPRESSION: 1. No testicular mass or evidence of torsion. No evidence of epididymitis/orchitis. 2. Moderate bilateral hydroceles. Significant scrotal skin thickening consistent with diffuse edema. Electronically Signed   By: Amie Portland M.D.   On: 02/19/2016 17:19   US Abdomen Limited  Result Date: 02/20/2016 CLINICAL DATA:  56 year old male. Assess for ascites. Subsequent encounter. EXAM: US ABDOMEN LIMITED - RIGHT UPPER QUADRANT COMPARISON:  02/19/2016. FINDINGS: Four quadrant  examination without findings of ascites. Patient treated with Lasix in the interim. IMPRESSION: No ascites detected. Electronically Signed   By: Lacy Duverney M.D.   On: 02/20/2016 11:08   US Abdomen Limited  Result Date: 02/19/2016 CLINICAL DATA:  Abdominal swelling question ascites, dyspnea, diabetes mellitus EXAM: LIMITED ABDOMEN ULTRASOUND FOR ASCITES TECHNIQUE: Limited ultrasound survey for ascites was performed in all four abdominal quadrants. COMPARISON:  05/10/2013 FINDINGS: Small amounts of ascites are identified in the RIGHT upper and RIGHT lower quadrants. Slightly irregular nodular appearance of liver suggesting cirrhosis. Spleen appears enlarged. IMPRESSION: Small amount of ascites in the RIGHT abdomen Electronically Signed   By: Ulyses Southward M.D.   On: 02/19/2016 17:16   US Venous Img Lower Bilateral  Result Date: 02/19/2016 CLINICAL DATA:  Bilateral lower extremity edema, pain and erythema. EXAM: BILATERAL LOWER EXTREMITY VENOUS DOPPLER ULTRASOUND TECHNIQUE: Gray-scale  sonography with graded compression, as well as color Doppler and duplex ultrasound were performed to evaluate the lower extremity deep venous systems from the level of the common femoral vein and including the common femoral, femoral, profunda femoral, popliteal and calf veins including the posterior tibial, peroneal and gastrocnemius veins when visible. The superficial great saphenous vein was also interrogated. Spectral Doppler was utilized to evaluate flow at rest and with distal augmentation maneuvers in the common femoral, femoral and popliteal veins. COMPARISON:  None. FINDINGS: RIGHT LOWER EXTREMITY Common Femoral Vein: No evidence of thrombus. Normal compressibility, respiratory phasicity and response to augmentation. Saphenofemoral Junction: No evidence of thrombus. Normal compressibility and flow on color Doppler imaging. Profunda Femoral Vein: No evidence of thrombus. Normal compressibility and flow on color Doppler  imaging. Femoral Vein: No evidence of thrombus. Normal compressibility, respiratory phasicity and response to augmentation. Popliteal Vein: No evidence of thrombus. Normal compressibility, respiratory phasicity and response to augmentation. Calf Veins: No evidence of thrombus. Normal compressibility and flow on color Doppler imaging. Superficial Great Saphenous Vein: No evidence of thrombus. Normal compressibility and flow on color Doppler imaging. Venous Reflux:  None. Other Findings: No evidence of superficial thrombophlebitis or abnormal fluid collection. LEFT LOWER EXTREMITY Common Femoral Vein: No evidence of thrombus. Normal compressibility, respiratory phasicity and response to augmentation. Saphenofemoral Junction: No evidence of thrombus. Normal compressibility and flow on color Doppler imaging. Profunda Femoral Vein: No evidence of thrombus. Normal compressibility and flow on color Doppler imaging. Femoral Vein: No evidence of thrombus. Normal compressibility, respiratory phasicity and response to augmentation. Popliteal Vein: No evidence of thrombus. Normal compressibility, respiratory phasicity and response to augmentation. Calf Veins: No evidence of thrombus. Normal compressibility and flow on color Doppler imaging. Superficial Great Saphenous Vein: No evidence of thrombus. Normal compressibility and flow on color Doppler imaging. Venous Reflux:  None. Other Findings: No evidence of superficial thrombophlebitis or abnormal fluid collection. IMPRESSION: No evidence of bilateral lower extremity deep venous thrombosis. Electronically Signed   By: Irish Lack M.D.   On: 02/19/2016 17:27   [place] Art/ven Flow Abd Pelv Doppler  Result Date: 02/19/2016 CLINICAL DATA:  Scrotal swelling for 4 weeks. EXAM: SCROTAL ULTRASOUND DOPPLER ULTRASOUND OF THE TESTICLES TECHNIQUE: Complete ultrasound examination of the testicles, epididymis, and other scrotal structures was performed. Color and spectral Doppler  ultrasound were also utilized to evaluate blood flow to the testicles. COMPARISON:  None. FINDINGS: Right testicle Measurements: 4.0 x 2.7 x 2.6 cm. No mass or microlithiasis visualized. Left testicle Measurements: 4.1 x 2.7 x 2.9 cm. No mass or microlithiasis visualized. Right epididymis:  Normal in size and appearance. Left epididymis:  Normal in size and appearance. Hydrocele:  Moderate bilateral hydroceles. Varicocele:  None visualized. Pulsed Doppler interrogation of both testes demonstrates normal low resistance arterial and venous waveforms bilaterally. There is significant scrotal skin thickening/edema. No soft tissue fluid collection to suggest an abscess. IMPRESSION: 1. No testicular mass or evidence of torsion. No evidence of epididymitis/orchitis. 2. Moderate bilateral hydroceles. Significant scrotal skin thickening consistent with diffuse edema. Electronically Signed   By: Amie Portland M.D.   On: 02/19/2016 17:19    Nikelle Malatesta, MD  [facility] Pager [phone]  If 7PM-7AM, please contact night-coverage [email or web address] Password [email or web address] 02/22/2016, 5:55 PM   LOS: 3 days

## 2016-02-23 LAB — GLUCOSE, CAPILLARY
GLUCOSE-CAPILLARY: 169 mg/dL — AB (ref 65–99)
Glucose-Capillary: 141 mg/dL — ABNORMAL HIGH (ref 65–99)
Glucose-Capillary: 221 mg/dL — ABNORMAL HIGH (ref 65–99)
Glucose-Capillary: 168 mg/dL — ABNORMAL HIGH (ref 65–99)

## 2016-02-23 LAB — CBC
HEMATOCRIT: 36.4 % — AB (ref 39.0–52.0)
HEMOGLOBIN: 12.3 g/dL — AB (ref 13.0–17.0)
MCH: 34.7 pg — ABNORMAL HIGH (ref 26.0–34.0)
MCHC: 33.8 g/dL (ref 30.0–36.0)
MCV: 102.8 fL — ABNORMAL HIGH (ref 78.0–100.0)
Platelets: 61 10*3/uL — ABNORMAL LOW (ref 150–400)
RBC: 3.54 MIL/uL — ABNORMAL LOW (ref 4.22–5.81)
RDW: 17.3 % — ABNORMAL HIGH (ref 11.5–15.5)
WBC: 4.8 10*3/uL (ref 4.0–10.5)

## 2016-02-23 LAB — COMPREHENSIVE METABOLIC PANEL
ALBUMIN: 2 g/dL — AB (ref 3.5–5.0)
ALK PHOS: 157 U/L — AB (ref 38–126)
ALT: 33 U/L (ref 17–63)
AST: 59 U/L — AB (ref 15–41)
Anion gap: 3 — ABNORMAL LOW (ref 5–15)
BILIRUBIN TOTAL: 6.5 mg/dL — AB (ref 0.3–1.2)
BUN: 9 mg/dL (ref 6–20)
CALCIUM: 8.3 mg/dL — AB (ref 8.9–10.3)
CO2: 26 mmol/L (ref 22–32)
CREATININE: 0.57 mg/dL — AB (ref 0.61–1.24)
Chloride: 108 mmol/L (ref 101–111)
GFR calc Af Amer: >60 mL/min (ref >60)
GFR calc non Af Amer: >60 mL/min (ref >60)
GLUCOSE: 132 mg/dL — AB (ref 65–99)
Potassium: 3.6 mmol/L (ref 3.5–5.1)
SODIUM: 137 mmol/L (ref 135–145)
TOTAL PROTEIN: 7.1 g/dL (ref 6.5–8.1)

## 2016-02-23 LAB — MAGNESIUM: Magnesium: 1.7 mg/dL (ref 1.7–2.4)

## 2016-02-23 LAB — VANCOMYCIN, TROUGH: VANCOMYCIN TR: 6 ug/mL — AB (ref 15–20)

## 2016-02-23 MED ORDER — SODIUM CHLORIDE 0.9 % IV SOLN
250.0000 mL | INTRAVENOUS | Status: DC | PRN
Start: 2016-02-23 — End: 2016-02-25

## 2016-02-23 MED ORDER — PROMETHAZINE HCL 25 MG/ML IJ SOLN
12.5000 mg | Freq: Four times a day (QID) | INTRAMUSCULAR | Status: DC | PRN
  Administered 2016-02-23: 12.5 mg via INTRAVENOUS
  Filled 2016-02-23: qty 1

## 2016-02-23 MED ORDER — SODIUM CHLORIDE 0.9% FLUSH
3.0000 mL | Freq: Two times a day (BID) | INTRAVENOUS | Status: DC
  Administered 2016-02-23 – 2016-02-25 (×4): 3 mL via INTRAVENOUS

## 2016-02-23 MED ORDER — VANCOMYCIN HCL IN DEXTROSE 1-5 GM/200ML-% IV SOLN
1000.0000 mg | Freq: Three times a day (TID) | INTRAVENOUS | Status: DC
  Administered 2016-02-23 – 2016-02-25 (×6): 1000 mg via INTRAVENOUS
  Filled 2016-02-23 (×6): qty 200

## 2016-02-23 MED ORDER — SODIUM CHLORIDE 0.9% FLUSH
3.0000 mL | INTRAVENOUS | Status: DC | PRN
  Administered 2016-02-23: 3 mL via INTRAVENOUS
  Filled 2016-02-23: qty 3

## 2016-02-23 NOTE — Progress Notes (Signed)
Pharmacy Antibiotic Note  Jesus BenderDavid W [name] is a 56 y.o. male admitted on 02/19/2016 with cellulitis/ wound infection.  Pharmacy has been consulted for Vancomycin dosing. Patient improving. Afebrile and scrotal ultrasound--negative for epididymitis, orchitis, or torsion. Vancomycin trough 0.586mcg/ml, will adjust dosing for goal trough of 10-5615mcg/ml.   Plan: Increase Vancomycin 1gm IV q8h. Goal trough is 10-5915mcg/ml F/U cultures and clinical progress Monitor V/S and labs, levels as indicated  Height: 6\' 2"  (188 cm) Weight: 263 lb 4.8 oz (119.4 kg) IBW/kg (Calculated) : 82.2  Temp (24hrs), Avg:98.3 F (36.8 C), Min:98 F (36.7 C), Max:98.6 F (37 C)   Recent Labs Lab 02/19/16 1222 02/20/16 0537 02/21/16 0444 02/22/16 0430 02/23/16 0541  WBC 4.1 4.4 4.1  --  4.8  CREATININE 0.48* 0.45* 0.54* 0.50* 0.57*  VANCOTROUGH  --   --   --   --  6*    Estimated Creatinine Clearance: 145 mL/min (by C-G formula based on SCr of 0.57 mg/dL (L)).    Allergies  Allergen Reactions  . Morphine And Related     Nausea/sick on stomach    Antimicrobials this admission: Vancomycin 2/5 >>    Dose adjustments this admission: N/A  Microbiology results: 2/5 BCx: ngtd  Thank you for allowing pharmacy to be a part of this patient's care. [name], BS Pharm D, BCPS Clinical Pharmacist Pager [phone] 02/23/2016 10:03 AM

## 2016-02-23 NOTE — Progress Notes (Signed)
PROGRESS NOTE  [name] ZOX:[id number] DOB: 1960/02/02 DOA: 02/19/2016 PCP: [facility]  Brief History:  56 y.o. male with medical history of diabetes mellitus, hypertension, stroke, alcohol abuse presented with a 5 day history of scrotal erythema and edema and pain. The patient first noticed erythema on his scrotum on 02/14/2016. On 02/16/2016, the patient began noticing increasing edema of his scrotum to the point of encompassing his penis. He has some subjective chills without fevers. He denied any nausea, vomiting, diarrhea, dysuria. However, he came to the emergency department on 02/19/2016 because he was having difficulty urinating because of progressive edema. In addition, the patient has been complaining of shortness of breath for the past 14 months which has worsened significantly over the past 2 weeks. He states that he is having difficulty walking from his bedroom to the bathroom without shortness of breath. He has noted increasing abdominal girth.   Unfortunately, the patient continues to drink alcohol. He drinks 6-8 x 12 oz beers anywhere from 4-7 days of the week. He denies any illicit drug use. He quit smoking 20 years ago after approximately 40-pack-year history. He last drank alcohol on 02/18/2016.   Assessment/Plan: Anasarca -Suspect this is related to liver disease and hypoalbuminemia -Continue with intravenous diuresis with lasix -BNP normal, echocardiogram is largely unremarkable. -volume status -9.2L -continue spironolactone  Scrotum cellulitis  -Continue vancomycin-->improving erythema -Scrotal ultrasound--negative for epididymitis, orchitis, or torsion -continue fluconazole for element of candida  Alcohol abuse with presumptive liver cirrhosis -Requested paracentesis--not enough ascites to perform safely -alpha-fetoprotein in normal range at 3.9 -Alcohol withdrawal protocol  Lower extremity edema and pain -venous  duplex--neg -urine protein creatinine ratio--not enough protein to perform -likely related to his cirrhosis, hypoalbuminemia,  LLL infiltrate -personally reviewed Chest x-ray--LLL opacity, vascular congestion -do not feel he has clinical syndrome consistent with pneumonia--will not start abx -Echocardiogram shows normal systolic and diastolic function -stable on RA presently -check procalcitonin  Thrombocytopenia -Serum B12: 2,003 -HIV negative -Likely related to the patient's alcoholic liver disease/cirrhosis  Hyperbilirubinemia -secondary to underlying cirrhosis -08/26/2014 hepatitis B surface, hepatitis C antibody negative  Diabetes mellitus type 2 -Hemoglobin A1c 5.0 -NovoLog sliding scale  Hypertension -The patient is not on any antihypertensive medications presently -Monitor clinically -Hydralazine when necessary SBP >180   Disposition Plan:   Home in 3-4 days  Family Communication:  No Family at bedside  Consultants:  none  Code Status:  FULL--confirmed with pt  DVT Prophylaxis:  SCDs   Procedures: As Listed in Progress Note Above  Antibiotics: Vancomycin 2/5>> Fluconazole 2/8>>    Subjective: No shortness of breath or chest pain  Objective: Vitals:   02/22/16 1516 02/22/16 2117 02/23/16 0339 02/23/16 1343  BP: (!) 148/70 118/64  124/68  Pulse: 95 94  97  Resp: 18 20  18   Temp: 98 F (36.7 C) 98.6 F (37 C)  98.8 F (37.1 C)  TempSrc: Oral Oral  Oral  SpO2: 96% 96%  97%  Weight:   119.4 kg (263 lb 4.8 oz)   Height:        Intake/Output Summary (Last 24 hours) at 02/23/16 1519 Last data filed at 02/23/16 1300  Gross per 24 hour  Intake             1333 ml  Output             3650 ml  Net            -  2317 ml   Weight change: -3.266 kg (-7 lb 3.2 oz) Exam:   General:  Pt is alert, follows commands appropriately, not in acute distress  HEENT: No icterus, No thrush, No neck mass, [place]/AT  Cardiovascular: RRR, S1/S2, no rubs, no  gallops+JVD  Respiratory: Bibasilar crackles. No wheezing. Good air movement.  Abdomen: Soft/+BS, non tender, non distended, no guarding  Extremities: 1-2 + LE edema, No lymphangitis, No petechiae, No rashes, no synovitis, scrotal edema improving  Skin: erythema in groin area improving.   Data Reviewed: I have personally reviewed following labs and imaging studies Basic Metabolic Panel:  Recent Labs Lab 02/19/16 1222 02/20/16 0537 02/21/16 0444 02/22/16 0430 02/23/16 0541  NA 136 136 138 138 137  K 3.7 3.5 3.1* 3.0* 3.6  CL 105 105 105 106 108  CO2 26 27 27 28 26   GLUCOSE 83 123* 94 83 132*  BUN 7 7 7 8 9   CREATININE 0.48* 0.45* 0.54* 0.50* 0.57*  CALCIUM 8.3* 8.0* 8.1* 8.1* 8.3*  MG  --   --  1.6*  --  1.7   Liver Function Tests:  Recent Labs Lab 02/19/16 1222 02/20/16 0537 02/22/16 0430 02/23/16 0541  AST 67* 59* 54* 59*  ALT 36 32 32 33  ALKPHOS 151* 152* 143* 157*  BILITOT 8.4* 7.7* 6.7* 6.5*  PROT 7.4 6.6 6.5 7.1  ALBUMIN 2.1* 1.9* 1.9* 2.0*   No results for input(s): LIPASE, AMYLASE in the last 168 hours. No results for input(s): AMMONIA in the last 168 hours. Coagulation Profile:  Recent Labs Lab 02/19/16 2027  INR 2.00   CBC:  Recent Labs Lab 02/19/16 1222 02/20/16 0537 02/21/16 0444 02/23/16 0541  WBC 4.1 4.4 4.1 4.8  NEUTROABS 2.4  --   --   --   HGB 12.2* 11.5* 11.7* 12.3*  HCT 36.4* 33.9* 35.2* 36.4*  MCV 102.2* 102.4* 102.6* 102.8*  PLT 67* 64* 68* 61*   Cardiac Enzymes: No results for input(s): CKTOTAL, CKMB, CKMBINDEX, TROPONINI in the last 168 hours. BNP: Invalid input(s): POCBNP CBG:  Recent Labs Lab 02/22/16 1126 02/22/16 1628 02/22/16 2109 02/23/16 0813 02/23/16 1125  GLUCAP 154* 183* 180* 141* 221*   HbA1C: No results for input(s): HGBA1C in the last 72 hours. Urine analysis:    Component Value Date/Time   COLORURINE AMBER (A) 02/19/2016 1035   APPEARANCEUR CLEAR 02/19/2016 1035   LABSPEC >1.030 (H)  02/19/2016 1035   PHURINE 5.5 02/19/2016 1035   GLUCOSEU 100 (A) 02/19/2016 1035   HGBUR NEGATIVE 02/19/2016 1035   BILIRUBINUR MODERATE (A) 02/19/2016 1035   KETONESUR TRACE (A) 02/19/2016 1035   PROTEINUR TRACE (A) 02/19/2016 1035   NITRITE NEGATIVE 02/19/2016 1035   LEUKOCYTESUR NEGATIVE 02/19/2016 1035   Sepsis Labs: @LABRCNTIP (procalcitonin:4,lacticidven:4) ) Recent Results (from the past 240 hour(s))  Culture, blood (Routine X 2) w Reflex to ID Panel     Status: None (Preliminary result)   Collection Time: 02/19/16 11:09 AM  Result Value Ref Range Status   Specimen Description BLOOD LEFT ARM  Final   Special Requests BOTTLES DRAWN AEROBIC AND ANAEROBIC 8CC  Final   Culture NO GROWTH 4 DAYS  Final   Report Status PENDING  Incomplete  Culture, blood (Routine X 2) w Reflex to ID Panel     Status: None (Preliminary result)   Collection Time: 02/19/16 11:09 AM  Result Value Ref Range Status   Specimen Description BLOOD RIGHT ARM  Final   Special Requests BOTTLES DRAWN AEROBIC AND ANAEROBIC  6CC  Final   Culture NO GROWTH 4 DAYS  Final   Report Status PENDING  Incomplete     Scheduled Meds: . fluconazole (DIFLUCAN) IV  100 mg Intravenous Q24H  . folic acid  1 mg Oral Daily  . furosemide  40 mg Intravenous BID  . Influenza vac split quadrivalent PF  0.5 mL Intramuscular Tomorrow-1000  . insulin aspart  0-9 Units Subcutaneous TID WC  . multivitamin with minerals  1 tablet Oral Daily  . nystatin   Topical BID  . pantoprazole  80 mg Oral Q1200  . pneumococcal 23 valent vaccine  0.5 mL Intramuscular Tomorrow-1000  . sodium chloride flush  3 mL Intravenous Q12H  . spironolactone  25 mg Oral Daily  . thiamine  100 mg Oral Daily   Or  . thiamine  100 mg Intravenous Daily  . vancomycin  1,000 mg Intravenous Q8H   Continuous Infusions:  Procedures/Studies: Dg Chest 2 View  Result Date: 02/19/2016 CLINICAL DATA:  56 year old with three-day history of scrotal swelling. Chronic 1  year history of shortness of breath that has acutely worsened recently. Current smoker and current history of diabetes. EXAM: CHEST  2 VIEW COMPARISON:  02/19/2016, 05/10/2013 and earlier including CTA chest 05/10/2013 and CT chest 01/11/2013. FINDINGS: Cardiac silhouette upper normal in size, unchanged. Hilar and mediastinal contours otherwise unremarkable. Airspace consolidation involving the left lower lobe and associated small left pleural effusion. Lungs otherwise clear. Mild pulmonary venous hypertension without overt edema. Degenerative changes involving the thoracic spine. IMPRESSION: Acute left lower lobe pneumonia and associated small left pleural effusion. Electronically Signed   By: Hulan Saashomas  Lawrence M.D.   On: 02/19/2016 17:21   [place] Scrotum  Result Date: 02/19/2016 CLINICAL DATA:  Scrotal swelling for 4 weeks. EXAM: SCROTAL ULTRASOUND DOPPLER ULTRASOUND OF THE TESTICLES TECHNIQUE: Complete ultrasound examination of the testicles, epididymis, and other scrotal structures was performed. Color and spectral Doppler ultrasound were also utilized to evaluate blood flow to the testicles. COMPARISON:  None. FINDINGS: Right testicle Measurements: 4.0 x 2.7 x 2.6 cm. No mass or microlithiasis visualized. Left testicle Measurements: 4.1 x 2.7 x 2.9 cm. No mass or microlithiasis visualized. Right epididymis:  Normal in size and appearance. Left epididymis:  Normal in size and appearance. Hydrocele:  Moderate bilateral hydroceles. Varicocele:  None visualized. Pulsed Doppler interrogation of both testes demonstrates normal low resistance arterial and venous waveforms bilaterally. There is significant scrotal skin thickening/edema. No soft tissue fluid collection to suggest an abscess. IMPRESSION: 1. No testicular mass or evidence of torsion. No evidence of epididymitis/orchitis. 2. Moderate bilateral hydroceles. Significant scrotal skin thickening consistent with diffuse edema. Electronically Signed   By: Amie Portlandavid   Ormond M.D.   On: 02/19/2016 17:19   [place] Abdomen Limited  Result Date: 02/20/2016 CLINICAL DATA:  56 year old male. Assess for ascites. Subsequent encounter. EXAM: US ABDOMEN LIMITED - RIGHT UPPER QUADRANT COMPARISON:  02/19/2016. FINDINGS: Four quadrant examination without findings of ascites. Patient treated with Lasix in the interim. IMPRESSION: No ascites detected. Electronically Signed   By: Lacy DuverneySteven  Olson M.D.   On: 02/20/2016 11:08   [place] Abdomen Limited  Result Date: 02/19/2016 CLINICAL DATA:  Abdominal swelling question ascites, dyspnea, diabetes mellitus EXAM: LIMITED ABDOMEN ULTRASOUND FOR ASCITES TECHNIQUE: Limited ultrasound survey for ascites was performed in all four abdominal quadrants. COMPARISON:  05/10/2013 FINDINGS: Small amounts of ascites are identified in the RIGHT upper and RIGHT lower quadrants. Slightly irregular nodular appearance of liver suggesting cirrhosis. Spleen appears enlarged. IMPRESSION: Small amount  of ascites in the RIGHT abdomen Electronically Signed   By: Ulyses Southward M.D.   On: 02/19/2016 17:16   US Venous Img Lower Bilateral  Result Date: 02/19/2016 CLINICAL DATA:  Bilateral lower extremity edema, pain and erythema. EXAM: BILATERAL LOWER EXTREMITY VENOUS DOPPLER ULTRASOUND TECHNIQUE: Gray-scale sonography with graded compression, as well as color Doppler and duplex ultrasound were performed to evaluate the lower extremity deep venous systems from the level of the common femoral vein and including the common femoral, femoral, profunda femoral, popliteal and calf veins including the posterior tibial, peroneal and gastrocnemius veins when visible. The superficial great saphenous vein was also interrogated. Spectral Doppler was utilized to evaluate flow at rest and with distal augmentation maneuvers in the common femoral, femoral and popliteal veins. COMPARISON:  None. FINDINGS: RIGHT LOWER EXTREMITY Common Femoral Vein: No evidence of thrombus. Normal compressibility,  respiratory phasicity and response to augmentation. Saphenofemoral Junction: No evidence of thrombus. Normal compressibility and flow on color Doppler imaging. Profunda Femoral Vein: No evidence of thrombus. Normal compressibility and flow on color Doppler imaging. Femoral Vein: No evidence of thrombus. Normal compressibility, respiratory phasicity and response to augmentation. Popliteal Vein: No evidence of thrombus. Normal compressibility, respiratory phasicity and response to augmentation. Calf Veins: No evidence of thrombus. Normal compressibility and flow on color Doppler imaging. Superficial Great Saphenous Vein: No evidence of thrombus. Normal compressibility and flow on color Doppler imaging. Venous Reflux:  None. Other Findings: No evidence of superficial thrombophlebitis or abnormal fluid collection. LEFT LOWER EXTREMITY Common Femoral Vein: No evidence of thrombus. Normal compressibility, respiratory phasicity and response to augmentation. Saphenofemoral Junction: No evidence of thrombus. Normal compressibility and flow on color Doppler imaging. Profunda Femoral Vein: No evidence of thrombus. Normal compressibility and flow on color Doppler imaging. Femoral Vein: No evidence of thrombus. Normal compressibility, respiratory phasicity and response to augmentation. Popliteal Vein: No evidence of thrombus. Normal compressibility, respiratory phasicity and response to augmentation. Calf Veins: No evidence of thrombus. Normal compressibility and flow on color Doppler imaging. Superficial Great Saphenous Vein: No evidence of thrombus. Normal compressibility and flow on color Doppler imaging. Venous Reflux:  None. Other Findings: No evidence of superficial thrombophlebitis or abnormal fluid collection. IMPRESSION: No evidence of bilateral lower extremity deep venous thrombosis. Electronically Signed   By: Irish Lack M.D.   On: 02/19/2016 17:27   [place] Art/ven Flow Abd Pelv Doppler  Result Date:  02/19/2016 CLINICAL DATA:  Scrotal swelling for 4 weeks. EXAM: SCROTAL ULTRASOUND DOPPLER ULTRASOUND OF THE TESTICLES TECHNIQUE: Complete ultrasound examination of the testicles, epididymis, and other scrotal structures was performed. Color and spectral Doppler ultrasound were also utilized to evaluate blood flow to the testicles. COMPARISON:  None. FINDINGS: Right testicle Measurements: 4.0 x 2.7 x 2.6 cm. No mass or microlithiasis visualized. Left testicle Measurements: 4.1 x 2.7 x 2.9 cm. No mass or microlithiasis visualized. Right epididymis:  Normal in size and appearance. Left epididymis:  Normal in size and appearance. Hydrocele:  Moderate bilateral hydroceles. Varicocele:  None visualized. Pulsed Doppler interrogation of both testes demonstrates normal low resistance arterial and venous waveforms bilaterally. There is significant scrotal skin thickening/edema. No soft tissue fluid collection to suggest an abscess. IMPRESSION: 1. No testicular mass or evidence of torsion. No evidence of epididymitis/orchitis. 2. Moderate bilateral hydroceles. Significant scrotal skin thickening consistent with diffuse edema. Electronically Signed   By: Amie Portland M.D.   On: 02/19/2016 17:19    Lyndsy Gilberto, MD  [facility] Pager [phone]  If 7PM-7AM, please contact  night-coverage [email or web address] Password [email or web address] 02/23/2016, 3:19 PM   LOS: 4 days

## 2016-02-24 LAB — PROCALCITONIN: Procalcitonin: <0.1 ng/mL

## 2016-02-24 LAB — BASIC METABOLIC PANEL
Anion gap: 4 — ABNORMAL LOW (ref 5–15)
BUN: 9 mg/dL (ref 6–20)
CHLORIDE: 106 mmol/L (ref 101–111)
CO2: 26 mmol/L (ref 22–32)
Calcium: 8.3 mg/dL — ABNORMAL LOW (ref 8.9–10.3)
Creatinine, Ser: 0.56 mg/dL — ABNORMAL LOW (ref 0.61–1.24)
GFR calc Af Amer: >60 mL/min (ref >60)
GFR calc non Af Amer: >60 mL/min (ref >60)
Glucose, Bld: 114 mg/dL — ABNORMAL HIGH (ref 65–99)
POTASSIUM: 3.5 mmol/L (ref 3.5–5.1)
SODIUM: 136 mmol/L (ref 135–145)

## 2016-02-24 LAB — GLUCOSE, CAPILLARY
GLUCOSE-CAPILLARY: 104 mg/dL — AB (ref 65–99)
GLUCOSE-CAPILLARY: 220 mg/dL — AB (ref 65–99)
Glucose-Capillary: 163 mg/dL — ABNORMAL HIGH (ref 65–99)
Glucose-Capillary: 253 mg/dL — ABNORMAL HIGH (ref 65–99)

## 2016-02-24 MED ORDER — FLUCONAZOLE 100 MG PO TABS
100.0000 mg | ORAL_TABLET | Freq: Every day | ORAL | Status: DC
  Administered 2016-02-24 – 2016-02-25 (×2): 100 mg via ORAL
  Filled 2016-02-24 (×2): qty 1

## 2016-02-24 NOTE — Progress Notes (Signed)
PROGRESS NOTE  [name] ZOX:[id number] DOB: 07-19-1960 DOA: 02/19/2016 PCP: [facility]  Brief History:  56 y.o. male with medical history of diabetes mellitus, hypertension, stroke, alcohol abuse presented with a 5 day history of scrotal erythema and edema and pain. The patient first noticed erythema on his scrotum on 02/14/2016. On 02/16/2016, the patient began noticing increasing edema of his scrotum to the point of encompassing his penis. He has some subjective chills without fevers. He denied any nausea, vomiting, diarrhea, dysuria. However, he came to the emergency department on 02/19/2016 because he was having difficulty urinating because of progressive edema. In addition, the patient has been complaining of shortness of breath for the past 14 months which has worsened significantly over the past 2 weeks. He states that he is having difficulty walking from his bedroom to the bathroom without shortness of breath. He has noted increasing abdominal girth.   Unfortunately, the patient continues to drink alcohol. He drinks 6-8 x 12 oz beers anywhere from 4-7 days of the week. He denies any illicit drug use. He quit smoking 20 years ago after approximately 40-pack-year history. He last drank alcohol on 02/18/2016.   Assessment/Plan: Anasarca -Suspect this is related to liver disease and hypoalbuminemia -Continue with intravenous diuresis with lasix, since he has continued evidence of volume overload -BNP normal, echocardiogram is largely unremarkable. -volume status -10.8L -continue spironolactone -renal function is stable  Scrotum cellulitis  -Continue vancomycin-->improving erythema -Scrotal ultrasound--negative for epididymitis, orchitis, or torsion -continue fluconazole for element of candida  Alcohol abuse with presumptive liver cirrhosis -Requested paracentesis--not enough ascites to perform safely -alpha-fetoprotein in normal range at 3.9 -Alcohol  withdrawal protocol  Lower extremity edema and pain -venous duplex--neg -urine protein creatinine ratio--not enough protein to perform -likely related to his cirrhosis, hypoalbuminemia,  LLL infiltrate -personally reviewed Chest x-ray--LLL opacity, vascular congestion -do not feel he has clinical syndrome consistent with pneumonia--will not start abx -Echocardiogram shows normal systolic and diastolic function -stable on RA presently -check procalcitonin  Thrombocytopenia -Serum B12: 2,003 -HIV negative -Likely related to the patient's alcoholic liver disease/cirrhosis  Hyperbilirubinemia -secondary to underlying cirrhosis -08/26/2014 hepatitis B surface, hepatitis C antibody negative  Diabetes mellitus type 2 -Hemoglobin A1c 5.0 -NovoLog sliding scale  Hypertension -The patient is not on any antihypertensive medications presently -Monitor clinically -Hydralazine when necessary SBP >180   Disposition Plan:   Home in 3-4 days  Family Communication:  No Family at bedside  Consultants:  none  Code Status:  FULL--confirmed with pt  DVT Prophylaxis:  SCDs   Procedures: As Listed in Progress Note Above  Antibiotics: Vancomycin 2/5>> Fluconazole 2/8>>    Subjective: Had nausea yesterday, now improved. No shortness of breath  Objective: Vitals:   02/23/16 2156 02/23/16 2300 02/24/16 0602 02/24/16 1400  BP: (!) 148/64 (!) 131/54 (!) 146/59 124/65  Pulse: 99 94 88 97  Resp: 18  18 18   Temp: 98.2 F (36.8 C)  98.5 F (36.9 C) 97.6 F (36.4 C)  TempSrc: Oral  Oral   SpO2: 94%  96% 97%  Weight:   118.5 kg (261 lb 3.9 oz)   Height:        Intake/Output Summary (Last 24 hours) at 02/24/16 1638 Last data filed at 02/24/16 1500  Gross per 24 hour  Intake             1730 ml  Output  3500 ml  Net            -1770 ml   Weight change: -0.932 kg (-2 lb 0.9 oz) Exam:   General:  Pt is alert, follows commands appropriately, not in acute  distress  HEENT: No icterus, No thrush, No neck mass, [place]/AT  Cardiovascular: RRR, S1/S2, no rubs, no gallops+JVD  Respiratory: Bibasilar crackles. No wheezing. Good air movement.  Abdomen: Soft/+BS, non tender, non distended, no guarding  Extremities: 1-2 + LE edema, No lymphangitis, No petechiae, No rashes, no synovitis, scrotal edema improving  Skin: erythema in groin area improving.   Data Reviewed: I have personally reviewed following labs and imaging studies Basic Metabolic Panel:  Recent Labs Lab 02/20/16 0537 02/21/16 0444 02/22/16 0430 02/23/16 0541 02/24/16 0633  NA 136 138 138 137 136  K 3.5 3.1* 3.0* 3.6 3.5  CL 105 105 106 108 106  CO2 27 27 28 26 26   GLUCOSE 123* 94 83 132* 114*  BUN 7 7 8 9 9   CREATININE 0.45* 0.54* 0.50* 0.57* 0.56*  CALCIUM 8.0* 8.1* 8.1* 8.3* 8.3*  MG  --  1.6*  --  1.7  --    Liver Function Tests:  Recent Labs Lab 02/19/16 1222 02/20/16 0537 02/22/16 0430 02/23/16 0541  AST 67* 59* 54* 59*  ALT 36 32 32 33  ALKPHOS 151* 152* 143* 157*  BILITOT 8.4* 7.7* 6.7* 6.5*  PROT 7.4 6.6 6.5 7.1  ALBUMIN 2.1* 1.9* 1.9* 2.0*   No results for input(s): LIPASE, AMYLASE in the last 168 hours. No results for input(s): AMMONIA in the last 168 hours. Coagulation Profile:  Recent Labs Lab 02/19/16 2027  INR 2.00   CBC:  Recent Labs Lab 02/19/16 1222 02/20/16 0537 02/21/16 0444 02/23/16 0541  WBC 4.1 4.4 4.1 4.8  NEUTROABS 2.4  --   --   --   HGB 12.2* 11.5* 11.7* 12.3*  HCT 36.4* 33.9* 35.2* 36.4*  MCV 102.2* 102.4* 102.6* 102.8*  PLT 67* 64* 68* 61*   Cardiac Enzymes: No results for input(s): CKTOTAL, CKMB, CKMBINDEX, TROPONINI in the last 168 hours. BNP: Invalid input(s): POCBNP CBG:  Recent Labs Lab 02/23/16 1555 02/23/16 2122 02/24/16 0733 02/24/16 1132 02/24/16 1618  GLUCAP 169* 168* 104* 163* 253*   HbA1C: No results for input(s): HGBA1C in the last 72 hours. Urine analysis:    Component Value  Date/Time   COLORURINE AMBER (A) 02/19/2016 1035   APPEARANCEUR CLEAR 02/19/2016 1035   LABSPEC >1.030 (H) 02/19/2016 1035   PHURINE 5.5 02/19/2016 1035   GLUCOSEU 100 (A) 02/19/2016 1035   HGBUR NEGATIVE 02/19/2016 1035   BILIRUBINUR MODERATE (A) 02/19/2016 1035   KETONESUR TRACE (A) 02/19/2016 1035   PROTEINUR TRACE (A) 02/19/2016 1035   NITRITE NEGATIVE 02/19/2016 1035   LEUKOCYTESUR NEGATIVE 02/19/2016 1035   Sepsis Labs: @LABRCNTIP (procalcitonin:4,lacticidven:4) ) Recent Results (from the past 240 hour(s))  Culture, blood (Routine X 2) w Reflex to ID Panel     Status: None (Preliminary result)   Collection Time: 02/19/16 11:09 AM  Result Value Ref Range Status   Specimen Description BLOOD LEFT ARM  Final   Special Requests BOTTLES DRAWN AEROBIC AND ANAEROBIC 8CC  Final   Culture NO GROWTH 4 DAYS  Final   Report Status PENDING  Incomplete  Culture, blood (Routine X 2) w Reflex to ID Panel     Status: None (Preliminary result)   Collection Time: 02/19/16 11:09 AM  Result Value Ref Range Status   Specimen  Description BLOOD RIGHT ARM  Final   Special Requests BOTTLES DRAWN AEROBIC AND ANAEROBIC 6CC  Final   Culture NO GROWTH 4 DAYS  Final   Report Status PENDING  Incomplete     Scheduled Meds: . fluconazole  100 mg Oral Daily  . folic acid  1 mg Oral Daily  . furosemide  40 mg Intravenous BID  . Influenza vac split quadrivalent PF  0.5 mL Intramuscular Tomorrow-1000  . insulin aspart  0-9 Units Subcutaneous TID WC  . multivitamin with minerals  1 tablet Oral Daily  . nystatin   Topical BID  . pantoprazole  80 mg Oral Q1200  . pneumococcal 23 valent vaccine  0.5 mL Intramuscular Tomorrow-1000  . sodium chloride flush  3 mL Intravenous Q12H  . spironolactone  25 mg Oral Daily  . thiamine  100 mg Oral Daily   Or  . thiamine  100 mg Intravenous Daily  . vancomycin  1,000 mg Intravenous Q8H   Continuous Infusions:  Procedures/Studies: Dg Chest 2 View  Result Date:  02/19/2016 CLINICAL DATA:  56 year old with three-day history of scrotal swelling. Chronic 1 year history of shortness of breath that has acutely worsened recently. Current smoker and current history of diabetes. EXAM: CHEST  2 VIEW COMPARISON:  02/19/2016, 05/10/2013 and earlier including CTA chest 05/10/2013 and CT chest 01/11/2013. FINDINGS: Cardiac silhouette upper normal in size, unchanged. Hilar and mediastinal contours otherwise unremarkable. Airspace consolidation involving the left lower lobe and associated small left pleural effusion. Lungs otherwise clear. Mild pulmonary venous hypertension without overt edema. Degenerative changes involving the thoracic spine. IMPRESSION: Acute left lower lobe pneumonia and associated small left pleural effusion. Electronically Signed   By: Hulan Saas M.D.   On: 02/19/2016 17:21   US Scrotum  Result Date: 02/19/2016 CLINICAL DATA:  Scrotal swelling for 4 weeks. EXAM: SCROTAL ULTRASOUND DOPPLER ULTRASOUND OF THE TESTICLES TECHNIQUE: Complete ultrasound examination of the testicles, epididymis, and other scrotal structures was performed. Color and spectral Doppler ultrasound were also utilized to evaluate blood flow to the testicles. COMPARISON:  None. FINDINGS: Right testicle Measurements: 4.0 x 2.7 x 2.6 cm. No mass or microlithiasis visualized. Left testicle Measurements: 4.1 x 2.7 x 2.9 cm. No mass or microlithiasis visualized. Right epididymis:  Normal in size and appearance. Left epididymis:  Normal in size and appearance. Hydrocele:  Moderate bilateral hydroceles. Varicocele:  None visualized. Pulsed Doppler interrogation of both testes demonstrates normal low resistance arterial and venous waveforms bilaterally. There is significant scrotal skin thickening/edema. No soft tissue fluid collection to suggest an abscess. IMPRESSION: 1. No testicular mass or evidence of torsion. No evidence of epididymitis/orchitis. 2. Moderate bilateral hydroceles. Significant  scrotal skin thickening consistent with diffuse edema. Electronically Signed   By: Amie Portland M.D.   On: 02/19/2016 17:19   US Abdomen Limited  Result Date: 02/20/2016 CLINICAL DATA:  56 year old male. Assess for ascites. Subsequent encounter. EXAM: US ABDOMEN LIMITED - RIGHT UPPER QUADRANT COMPARISON:  02/19/2016. FINDINGS: Four quadrant examination without findings of ascites. Patient treated with Lasix in the interim. IMPRESSION: No ascites detected. Electronically Signed   By: Lacy Duverney M.D.   On: 02/20/2016 11:08   US Abdomen Limited  Result Date: 02/19/2016 CLINICAL DATA:  Abdominal swelling question ascites, dyspnea, diabetes mellitus EXAM: LIMITED ABDOMEN ULTRASOUND FOR ASCITES TECHNIQUE: Limited ultrasound survey for ascites was performed in all four abdominal quadrants. COMPARISON:  05/10/2013 FINDINGS: Small amounts of ascites are identified in the RIGHT upper and RIGHT lower quadrants. Slightly  irregular nodular appearance of liver suggesting cirrhosis. Spleen appears enlarged. IMPRESSION: Small amount of ascites in the RIGHT abdomen Electronically Signed   By: Ulyses SouthwardMark  Boles M.D.   On: 02/19/2016 17:16   [place] Venous Img Lower Bilateral  Result Date: 02/19/2016 CLINICAL DATA:  Bilateral lower extremity edema, pain and erythema. EXAM: BILATERAL LOWER EXTREMITY VENOUS DOPPLER ULTRASOUND TECHNIQUE: Gray-scale sonography with graded compression, as well as color Doppler and duplex ultrasound were performed to evaluate the lower extremity deep venous systems from the level of the common femoral vein and including the common femoral, femoral, profunda femoral, popliteal and calf veins including the posterior tibial, peroneal and gastrocnemius veins when visible. The superficial great saphenous vein was also interrogated. Spectral Doppler was utilized to evaluate flow at rest and with distal augmentation maneuvers in the common femoral, femoral and popliteal veins. COMPARISON:  None. FINDINGS: RIGHT  LOWER EXTREMITY Common Femoral Vein: No evidence of thrombus. Normal compressibility, respiratory phasicity and response to augmentation. Saphenofemoral Junction: No evidence of thrombus. Normal compressibility and flow on color Doppler imaging. Profunda Femoral Vein: No evidence of thrombus. Normal compressibility and flow on color Doppler imaging. Femoral Vein: No evidence of thrombus. Normal compressibility, respiratory phasicity and response to augmentation. Popliteal Vein: No evidence of thrombus. Normal compressibility, respiratory phasicity and response to augmentation. Calf Veins: No evidence of thrombus. Normal compressibility and flow on color Doppler imaging. Superficial Great Saphenous Vein: No evidence of thrombus. Normal compressibility and flow on color Doppler imaging. Venous Reflux:  None. Other Findings: No evidence of superficial thrombophlebitis or abnormal fluid collection. LEFT LOWER EXTREMITY Common Femoral Vein: No evidence of thrombus. Normal compressibility, respiratory phasicity and response to augmentation. Saphenofemoral Junction: No evidence of thrombus. Normal compressibility and flow on color Doppler imaging. Profunda Femoral Vein: No evidence of thrombus. Normal compressibility and flow on color Doppler imaging. Femoral Vein: No evidence of thrombus. Normal compressibility, respiratory phasicity and response to augmentation. Popliteal Vein: No evidence of thrombus. Normal compressibility, respiratory phasicity and response to augmentation. Calf Veins: No evidence of thrombus. Normal compressibility and flow on color Doppler imaging. Superficial Great Saphenous Vein: No evidence of thrombus. Normal compressibility and flow on color Doppler imaging. Venous Reflux:  None. Other Findings: No evidence of superficial thrombophlebitis or abnormal fluid collection. IMPRESSION: No evidence of bilateral lower extremity deep venous thrombosis. Electronically Signed   By: Irish LackGlenn  Yamagata M.D.    On: 02/19/2016 17:27   [place] Art/ven Flow Abd Pelv Doppler  Result Date: 02/19/2016 CLINICAL DATA:  Scrotal swelling for 4 weeks. EXAM: SCROTAL ULTRASOUND DOPPLER ULTRASOUND OF THE TESTICLES TECHNIQUE: Complete ultrasound examination of the testicles, epididymis, and other scrotal structures was performed. Color and spectral Doppler ultrasound were also utilized to evaluate blood flow to the testicles. COMPARISON:  None. FINDINGS: Right testicle Measurements: 4.0 x 2.7 x 2.6 cm. No mass or microlithiasis visualized. Left testicle Measurements: 4.1 x 2.7 x 2.9 cm. No mass or microlithiasis visualized. Right epididymis:  Normal in size and appearance. Left epididymis:  Normal in size and appearance. Hydrocele:  Moderate bilateral hydroceles. Varicocele:  None visualized. Pulsed Doppler interrogation of both testes demonstrates normal low resistance arterial and venous waveforms bilaterally. There is significant scrotal skin thickening/edema. No soft tissue fluid collection to suggest an abscess. IMPRESSION: 1. No testicular mass or evidence of torsion. No evidence of epididymitis/orchitis. 2. Moderate bilateral hydroceles. Significant scrotal skin thickening consistent with diffuse edema. Electronically Signed   By: Amie Portlandavid  Ormond M.D.   On: 02/19/2016 17:19  Yassir Enis, MD  [facility] Pager [phone]  If 7PM-7AM, please contact night-coverage [email or web address] Password [email or web address] 02/24/2016, 4:38 PM   LOS: 5 days

## 2016-02-25 LAB — GLUCOSE, CAPILLARY
GLUCOSE-CAPILLARY: 141 mg/dL — AB (ref 65–99)
Glucose-Capillary: 221 mg/dL — ABNORMAL HIGH (ref 65–99)

## 2016-02-25 LAB — BASIC METABOLIC PANEL
ANION GAP: 4 — AB (ref 5–15)
BUN: 10 mg/dL (ref 6–20)
CALCIUM: 8.4 mg/dL — AB (ref 8.9–10.3)
CO2: 26 mmol/L (ref 22–32)
Chloride: 105 mmol/L (ref 101–111)
Creatinine, Ser: 0.53 mg/dL — ABNORMAL LOW (ref 0.61–1.24)
Glucose, Bld: 144 mg/dL — ABNORMAL HIGH (ref 65–99)
Potassium: 3.6 mmol/L (ref 3.5–5.1)
SODIUM: 135 mmol/L (ref 135–145)

## 2016-02-25 MED ORDER — SPIRONOLACTONE 25 MG PO TABS: 25.0000 mg | ORAL_TABLET | Freq: Every day | ORAL | 0 refills | Status: AC

## 2016-02-25 MED ORDER — FUROSEMIDE 40 MG PO TABS: 40.0000 mg | ORAL_TABLET | Freq: Every day | ORAL | 0 refills | Status: DC

## 2016-02-25 NOTE — Progress Notes (Signed)
IV removed, WNL. D/C instructions given to pt. Verbalized understanding. Pt mother is here to transport home.

## 2016-02-25 NOTE — Discharge Summary (Signed)
Physician Discharge Summary  [name] ZOX:[id number] DOB: 1961/01/04 DOA: 02/19/2016  PCP: [facility]  Admit date: 02/19/2016 Discharge date: 02/25/2016  Admitted From: home Disposition:  home  Recommendations for Outpatient Follow-up:  1. Follow up with PCP in 1-2 weeks 2. Please obtain BMP/CBC in one week   Home Health: Equipment/Devices:  Discharge Condition: stable CODE STATUS: full code Diet recommendation: Heart Healthy   Brief/Interim Summary: 56 y.o.malewith medical history of diabetes mellitus, hypertension, stroke, alcohol abuse presented with a 5 day history of scrotal erythema and edema and pain. The patient first noticed erythema on his scrotum on 02/14/2016. On 02/16/2016, the patient began noticing increasing edema of his scrotum to the point of encompassing his penis. He has some subjective chills without fevers. He denied any nausea, vomiting, diarrhea, dysuria. However, he came to the emergency department on 02/19/2016 because he was having difficulty urinating because of progressive edema. In addition, the patient has been complaining of shortness of breath for the past 14 months which has worsened significantly over the past 2 weeks. He states that he is having difficulty walking from his bedroom to the bathroom without shortness of breath. He has noted increasing abdominal girth.  Unfortunately, the patient continues to drink alcohol. He drinks 6-8 x 12 oz beers anywhere from 4-7 days of the week. He denies any illicit drug use. He quit smoking 20 years ago after approximately 40-pack-year history. He last drankalcohol on 02/18/2016.  Discharge Diagnoses:  Active Problems:   Alcohol abuse   Cellulitis of scrotum   Liver cirrhosis, alcoholic (HCC)   Thrombocytopenia (HCC)   Hyperbilirubinemia   Acute CHF (HCC)   Ascites  Anasarca -Suspect this is related to liver disease and hypoalbuminemia -Treated with intravenous diuresis with lasix  with improvement in volume status. Transitioned to oral lasix -BNP normal, echocardiogram is largely unremarkable. -volume status -13.1L -continue spironolactone -renal function is stable  Scrotum cellulitis  -Treated with vancomycin-->improving erythema -Scrotal ultrasound--negative for epididymitis, orchitis, or torsion -Also treated with fluconazole for element of candida -resolved  Alcohol abuse with presumptive liver cirrhosis -Requested paracentesis--not enough ascites to perform safely -alpha-fetoprotein in normal range at 3.9 - was monitored on Alcohol withdrawal protocol  Lower extremity edema and pain -venous duplex--neg -urine protein creatinine ratio--not enough protein to perform -likely related to his cirrhosis, hypoalbuminemia,  LLL infiltrate -personally reviewed Chest x-ray--LLL opacity, vascular congestion -do not feel he has clinical syndrome consistent with pneumonia--will not start abx -Echocardiogram shows normal systolic and diastolic function -stable on RA presently -afebrile  Thrombocytopenia -Serum B12: 2,003 -HIV negative -Likely related to the patient's alcoholic liver disease/cirrhosis  Hyperbilirubinemia -secondary to underlying cirrhosis, stable -08/26/2014 hepatitis B surface, hepatitis C antibody negative  Diabetes mellitus type 2 -Hemoglobin A1c 5.0 -NovoLog sliding scale  Hypertension -The patient is not on any antihypertensive medications presently -Monitor clinically -Hydralazine when necessary SBP >180  Discharge Instructions  Discharge Instructions    Diet - low sodium heart healthy    Complete by:  As directed    Increase activity slowly    Complete by:  As directed      Allergies as of 02/25/2016      Reactions   Morphine And Related    Nausea/sick on stomach      Medication List    TAKE these medications   aspirin EC 325 MG tablet Take 1 tablet by mouth daily.   esomeprazole 40 MG capsule Commonly  known as:  NEXIUM Take 1 capsule  by mouth daily.   furosemide 40 MG tablet Commonly known as:  LASIX Take 1 tablet (40 mg total) by mouth daily.   glyBURIDE 5 MG tablet Commonly known as:  DIABETA Take 5 mg by mouth 2 (two) times daily with a meal.   metFORMIN 500 MG tablet Commonly known as:  GLUCOPHAGE Take 500 mg by mouth 2 (two) times daily with a meal.   spironolactone 25 MG tablet Commonly known as:  ALDACTONE Take 1 tablet (25 mg total) by mouth daily. Start taking on:  02/26/2016       Allergies  Allergen Reactions  . Morphine And Related     Nausea/sick on stomach    Consultations:     Procedures/Studies: Dg Chest 2 View  Result Date: 02/19/2016 CLINICAL DATA:  56 year old with three-day history of scrotal swelling. Chronic 1 year history of shortness of breath that has acutely worsened recently. Current smoker and current history of diabetes. EXAM: CHEST  2 VIEW COMPARISON:  02/19/2016, 05/10/2013 and earlier including CTA chest 05/10/2013 and CT chest 01/11/2013. FINDINGS: Cardiac silhouette upper normal in size, unchanged. Hilar and mediastinal contours otherwise unremarkable. Airspace consolidation involving the left lower lobe and associated small left pleural effusion. Lungs otherwise clear. Mild pulmonary venous hypertension without overt edema. Degenerative changes involving the thoracic spine. IMPRESSION: Acute left lower lobe pneumonia and associated small left pleural effusion. Electronically Signed   By: Hulan Saas M.D.   On: 02/19/2016 17:21   US Scrotum  Result Date: 02/19/2016 CLINICAL DATA:  Scrotal swelling for 4 weeks. EXAM: SCROTAL ULTRASOUND DOPPLER ULTRASOUND OF THE TESTICLES TECHNIQUE: Complete ultrasound examination of the testicles, epididymis, and other scrotal structures was performed. Color and spectral Doppler ultrasound were also utilized to evaluate blood flow to the testicles. COMPARISON:  None. FINDINGS: Right testicle Measurements:  4.0 x 2.7 x 2.6 cm. No mass or microlithiasis visualized. Left testicle Measurements: 4.1 x 2.7 x 2.9 cm. No mass or microlithiasis visualized. Right epididymis:  Normal in size and appearance. Left epididymis:  Normal in size and appearance. Hydrocele:  Moderate bilateral hydroceles. Varicocele:  None visualized. Pulsed Doppler interrogation of both testes demonstrates normal low resistance arterial and venous waveforms bilaterally. There is significant scrotal skin thickening/edema. No soft tissue fluid collection to suggest an abscess. IMPRESSION: 1. No testicular mass or evidence of torsion. No evidence of epididymitis/orchitis. 2. Moderate bilateral hydroceles. Significant scrotal skin thickening consistent with diffuse edema. Electronically Signed   By: Amie Portland M.D.   On: 02/19/2016 17:19   US Abdomen Limited  Result Date: 02/20/2016 CLINICAL DATA:  56 year old male. Assess for ascites. Subsequent encounter. EXAM: US ABDOMEN LIMITED - RIGHT UPPER QUADRANT COMPARISON:  02/19/2016. FINDINGS: Four quadrant examination without findings of ascites. Patient treated with Lasix in the interim. IMPRESSION: No ascites detected. Electronically Signed   By: Lacy Duverney M.D.   On: 02/20/2016 11:08   US Abdomen Limited  Result Date: 02/19/2016 CLINICAL DATA:  Abdominal swelling question ascites, dyspnea, diabetes mellitus EXAM: LIMITED ABDOMEN ULTRASOUND FOR ASCITES TECHNIQUE: Limited ultrasound survey for ascites was performed in all four abdominal quadrants. COMPARISON:  05/10/2013 FINDINGS: Small amounts of ascites are identified in the RIGHT upper and RIGHT lower quadrants. Slightly irregular nodular appearance of liver suggesting cirrhosis. Spleen appears enlarged. IMPRESSION: Small amount of ascites in the RIGHT abdomen Electronically Signed   By: Ulyses Southward M.D.   On: 02/19/2016 17:16   US Venous Img Lower Bilateral  Result Date: 02/19/2016 CLINICAL DATA:  Bilateral lower  extremity edema, pain and  erythema. EXAM: BILATERAL LOWER EXTREMITY VENOUS DOPPLER ULTRASOUND TECHNIQUE: Gray-scale sonography with graded compression, as well as color Doppler and duplex ultrasound were performed to evaluate the lower extremity deep venous systems from the level of the common femoral vein and including the common femoral, femoral, profunda femoral, popliteal and calf veins including the posterior tibial, peroneal and gastrocnemius veins when visible. The superficial great saphenous vein was also interrogated. Spectral Doppler was utilized to evaluate flow at rest and with distal augmentation maneuvers in the common femoral, femoral and popliteal veins. COMPARISON:  None. FINDINGS: RIGHT LOWER EXTREMITY Common Femoral Vein: No evidence of thrombus. Normal compressibility, respiratory phasicity and response to augmentation. Saphenofemoral Junction: No evidence of thrombus. Normal compressibility and flow on color Doppler imaging. Profunda Femoral Vein: No evidence of thrombus. Normal compressibility and flow on color Doppler imaging. Femoral Vein: No evidence of thrombus. Normal compressibility, respiratory phasicity and response to augmentation. Popliteal Vein: No evidence of thrombus. Normal compressibility, respiratory phasicity and response to augmentation. Calf Veins: No evidence of thrombus. Normal compressibility and flow on color Doppler imaging. Superficial Great Saphenous Vein: No evidence of thrombus. Normal compressibility and flow on color Doppler imaging. Venous Reflux:  None. Other Findings: No evidence of superficial thrombophlebitis or abnormal fluid collection. LEFT LOWER EXTREMITY Common Femoral Vein: No evidence of thrombus. Normal compressibility, respiratory phasicity and response to augmentation. Saphenofemoral Junction: No evidence of thrombus. Normal compressibility and flow on color Doppler imaging. Profunda Femoral Vein: No evidence of thrombus. Normal compressibility and flow on color Doppler  imaging. Femoral Vein: No evidence of thrombus. Normal compressibility, respiratory phasicity and response to augmentation. Popliteal Vein: No evidence of thrombus. Normal compressibility, respiratory phasicity and response to augmentation. Calf Veins: No evidence of thrombus. Normal compressibility and flow on color Doppler imaging. Superficial Great Saphenous Vein: No evidence of thrombus. Normal compressibility and flow on color Doppler imaging. Venous Reflux:  None. Other Findings: No evidence of superficial thrombophlebitis or abnormal fluid collection. IMPRESSION: No evidence of bilateral lower extremity deep venous thrombosis. Electronically Signed   By: Irish LackGlenn  Yamagata M.D.   On: 02/19/2016 17:27   [place] Art/ven Flow Abd Pelv Doppler  Result Date: 02/19/2016 CLINICAL DATA:  Scrotal swelling for 4 weeks. EXAM: SCROTAL ULTRASOUND DOPPLER ULTRASOUND OF THE TESTICLES TECHNIQUE: Complete ultrasound examination of the testicles, epididymis, and other scrotal structures was performed. Color and spectral Doppler ultrasound were also utilized to evaluate blood flow to the testicles. COMPARISON:  None. FINDINGS: Right testicle Measurements: 4.0 x 2.7 x 2.6 cm. No mass or microlithiasis visualized. Left testicle Measurements: 4.1 x 2.7 x 2.9 cm. No mass or microlithiasis visualized. Right epididymis:  Normal in size and appearance. Left epididymis:  Normal in size and appearance. Hydrocele:  Moderate bilateral hydroceles. Varicocele:  None visualized. Pulsed Doppler interrogation of both testes demonstrates normal low resistance arterial and venous waveforms bilaterally. There is significant scrotal skin thickening/edema. No soft tissue fluid collection to suggest an abscess. IMPRESSION: 1. No testicular mass or evidence of torsion. No evidence of epididymitis/orchitis. 2. Moderate bilateral hydroceles. Significant scrotal skin thickening consistent with diffuse edema. Electronically Signed   By: Amie Portlandavid  Ormond M.D.    On: 02/19/2016 17:19    Echo: - Left ventricle: The cavity size was normal. Systolic function was   normal. The estimated ejection fraction was in the range of 60%   to 65%. Wall motion was normal; there were no regional wall   motion abnormalities. Left ventricular diastolic function  parameters were normal. - Aortic valve: Valve area (VTI): 2.27 cm^2. Valve area (Vmax):   2.42 cm^2. Valve area (Vmean): 2.25 cm^2. - Atrial septum: No defect or patent foramen ovale was identified. - Pulmonary arteries: PA peak pressure: 32 mm Hg (S).    Subjective: Edema has improved, no shortness of breath  Discharge Exam: Vitals:   02/24/16 2000 02/25/16 0618  BP: (!) 152/71 (!) 142/68  Pulse: 98 96  Resp: 20 20  Temp: 98.6 F (37 C) 98.5 F (36.9 C)   Vitals:   02/24/16 0602 02/24/16 1400 02/24/16 2000 02/25/16 0618  BP: (!) 146/59 124/65 (!) 152/71 (!) 142/68  Pulse: 88 97 98 96  Resp: 18 18 20 20   Temp: 98.5 F (36.9 C) 97.6 F (36.4 C) 98.6 F (37 C) 98.5 F (36.9 C)  TempSrc: Oral  Oral Oral  SpO2: 96% 97% 95% 96%  Weight: 118.5 kg (261 lb 3.9 oz)   117.6 kg (259 lb 4.2 oz)  Height:        General: Pt is alert, awake, not in acute distress Cardiovascular: RRR, S1/S2 +, no rubs, no gallops Respiratory: CTA bilaterally, no wheezing, no rhonchi Abdominal: Soft, NT, ND, bowel sounds + Extremities: 1+ edema, no cyanosis    The results of significant diagnostics from this hospitalization (including imaging, microbiology, ancillary and laboratory) are listed below for reference.     Microbiology: Recent Results (from the past 240 hour(s))  Culture, blood (Routine X 2) w Reflex to ID Panel     Status: None (Preliminary result)   Collection Time: 02/19/16 11:09 AM  Result Value Ref Range Status   Specimen Description BLOOD LEFT ARM  Final   Special Requests BOTTLES DRAWN AEROBIC AND ANAEROBIC 8CC  Final   Culture NO GROWTH 4 DAYS  Final   Report Status PENDING  Incomplete   Culture, blood (Routine X 2) w Reflex to ID Panel     Status: None (Preliminary result)   Collection Time: 02/19/16 11:09 AM  Result Value Ref Range Status   Specimen Description BLOOD RIGHT ARM  Final   Special Requests BOTTLES DRAWN AEROBIC AND ANAEROBIC 6CC  Final   Culture NO GROWTH 4 DAYS  Final   Report Status PENDING  Incomplete     Labs: BNP (last 3 results)  Recent Labs  02/20/16 0537  BNP 56.0   Basic Metabolic Panel:  Recent Labs Lab 02/21/16 0444 02/22/16 0430 02/23/16 0541 02/24/16 0633 02/25/16 0649  NA 138 138 137 136 135  K 3.1* 3.0* 3.6 3.5 3.6  CL 105 106 108 106 105  CO2 27 28 26 26 26   GLUCOSE 94 83 132* 114* 144*  BUN 7 8 9 9 10   CREATININE 0.54* 0.50* 0.57* 0.56* 0.53*  CALCIUM 8.1* 8.1* 8.3* 8.3* 8.4*  MG 1.6*  --  1.7  --   --    Liver Function Tests:  Recent Labs Lab 02/19/16 1222 02/20/16 0537 02/22/16 0430 02/23/16 0541  AST 67* 59* 54* 59*  ALT 36 32 32 33  ALKPHOS 151* 152* 143* 157*  BILITOT 8.4* 7.7* 6.7* 6.5*  PROT 7.4 6.6 6.5 7.1  ALBUMIN 2.1* 1.9* 1.9* 2.0*   No results for input(s): LIPASE, AMYLASE in the last 168 hours. No results for input(s): AMMONIA in the last 168 hours. CBC:  Recent Labs Lab 02/19/16 1222 02/20/16 0537 02/21/16 0444 02/23/16 0541  WBC 4.1 4.4 4.1 4.8  NEUTROABS 2.4  --   --   --  HGB 12.2* 11.5* 11.7* 12.3*  HCT 36.4* 33.9* 35.2* 36.4*  MCV 102.2* 102.4* 102.6* 102.8*  PLT 67* 64* 68* 61*   Cardiac Enzymes: No results for input(s): CKTOTAL, CKMB, CKMBINDEX, TROPONINI in the last 168 hours. BNP: Invalid input(s): POCBNP CBG:  Recent Labs Lab 02/24/16 1132 02/24/16 1618 02/24/16 2106 02/25/16 0734 02/25/16 1124  GLUCAP 163* 253* 220* 141* 221*   D-Dimer No results for input(s): DDIMER in the last 72 hours. Hgb A1c No results for input(s): HGBA1C in the last 72 hours. Lipid Profile No results for input(s): CHOL, HDL, LDLCALC, TRIG, CHOLHDL, LDLDIRECT in the last 72  hours. Thyroid function studies No results for input(s): TSH, T4TOTAL, T3FREE, THYROIDAB in the last 72 hours.  Invalid input(s): FREET3 Anemia work up No results for input(s): VITAMINB12, FOLATE, FERRITIN, TIBC, IRON, RETICCTPCT in the last 72 hours. Urinalysis    Component Value Date/Time   COLORURINE AMBER (A) 02/19/2016 1035   APPEARANCEUR CLEAR 02/19/2016 1035   LABSPEC >1.030 (H) 02/19/2016 1035   PHURINE 5.5 02/19/2016 1035   GLUCOSEU 100 (A) 02/19/2016 1035   HGBUR NEGATIVE 02/19/2016 1035   BILIRUBINUR MODERATE (A) 02/19/2016 1035   KETONESUR TRACE (A) 02/19/2016 1035   PROTEINUR TRACE (A) 02/19/2016 1035   NITRITE NEGATIVE 02/19/2016 1035   LEUKOCYTESUR NEGATIVE 02/19/2016 1035   Sepsis Labs Invalid input(s): PROCALCITONIN,  WBC,  LACTICIDVEN Microbiology Recent Results (from the past 240 hour(s))  Culture, blood (Routine X 2) w Reflex to ID Panel     Status: None (Preliminary result)   Collection Time: 02/19/16 11:09 AM  Result Value Ref Range Status   Specimen Description BLOOD LEFT ARM  Final   Special Requests BOTTLES DRAWN AEROBIC AND ANAEROBIC 8CC  Final   Culture NO GROWTH 4 DAYS  Final   Report Status PENDING  Incomplete  Culture, blood (Routine X 2) w Reflex to ID Panel     Status: None (Preliminary result)   Collection Time: 02/19/16 11:09 AM  Result Value Ref Range Status   Specimen Description BLOOD RIGHT ARM  Final   Special Requests BOTTLES DRAWN AEROBIC AND ANAEROBIC 6CC  Final   Culture NO GROWTH 4 DAYS  Final   Report Status PENDING  Incomplete     Time coordinating discharge: Over 30 minutes  SIGNED:   Erick Blinks, MD  [facility] 02/25/2016, 3:27 PM Pager   If 7PM-7AM, please contact night-coverage [email or web address] Password [email or web address]

## 2016-02-27 LAB — CULTURE, BLOOD (ROUTINE X 2)
Culture: NO GROWTH
Culture: NO GROWTH

## 2016-04-29 ENCOUNTER — Encounter: Payer: Self-pay | Admitting: Internal Medicine

## 2016-05-23 ENCOUNTER — Ambulatory Visit: Payer: Self-pay | Admitting: Nurse Practitioner

## 2016-05-30 ENCOUNTER — Encounter (HOSPITAL_COMMUNITY): Payer: Self-pay | Admitting: Emergency Medicine

## 2016-05-30 ENCOUNTER — Emergency Department (HOSPITAL_COMMUNITY): Payer: Self-pay

## 2016-05-30 ENCOUNTER — Emergency Department (HOSPITAL_COMMUNITY)
Admission: EM | Admit: 2016-05-30 | Discharge: 2016-05-30 | Disposition: A | Discharge status: Home / Self Care | Payer: Self-pay | Attending: Emergency Medicine | Admitting: Emergency Medicine

## 2016-05-30 DIAGNOSIS — E119 Type 2 diabetes mellitus without complications: Secondary | ICD-10-CM | POA: Insufficient documentation

## 2016-05-30 DIAGNOSIS — Z79899 Other long term (current) drug therapy: Secondary | ICD-10-CM | POA: Insufficient documentation

## 2016-05-30 DIAGNOSIS — I509 Heart failure, unspecified: Secondary | ICD-10-CM | POA: Insufficient documentation

## 2016-05-30 DIAGNOSIS — Z466 Encounter for fitting and adjustment of urinary device: Secondary | ICD-10-CM | POA: Insufficient documentation

## 2016-05-30 DIAGNOSIS — Z7984 Long term (current) use of oral hypoglycemic drugs: Secondary | ICD-10-CM | POA: Insufficient documentation

## 2016-05-30 DIAGNOSIS — Z8673 Personal history of transient ischemic attack (TIA), and cerebral infarction without residual deficits: Secondary | ICD-10-CM | POA: Insufficient documentation

## 2016-05-30 DIAGNOSIS — Z7982 Long term (current) use of aspirin: Secondary | ICD-10-CM | POA: Insufficient documentation

## 2016-05-30 DIAGNOSIS — E871 Hypo-osmolality and hyponatremia: Secondary | ICD-10-CM | POA: Insufficient documentation

## 2016-05-30 HISTORY — DX: Cellulitis, unspecified: L03.90

## 2016-05-30 HISTORY — DX: Hepatic failure, unspecified without coma: K72.90

## 2016-05-30 LAB — URINALYSIS, ROUTINE W REFLEX MICROSCOPIC
GLUCOSE, UA: NEGATIVE mg/dL
Ketones, ur: NEGATIVE mg/dL
Leukocytes, UA: NEGATIVE
Nitrite: NEGATIVE
PH: 5 (ref 5.0–8.0)
PROTEIN: 30 mg/dL — AB
Specific Gravity, Urine: 1.028 (ref 1.005–1.030)

## 2016-05-30 LAB — COMPREHENSIVE METABOLIC PANEL
ALK PHOS: 170 U/L — AB (ref 38–126)
ALT: 42 U/L (ref 17–63)
ANION GAP: 6 (ref 5–15)
AST: 82 U/L — ABNORMAL HIGH (ref 15–41)
Albumin: 1.6 g/dL — ABNORMAL LOW (ref 3.5–5.0)
BILIRUBIN TOTAL: 12.2 mg/dL — AB (ref 0.3–1.2)
BUN: 17 mg/dL (ref 6–20)
CALCIUM: 8.3 mg/dL — AB (ref 8.9–10.3)
CO2: 25 mmol/L (ref 22–32)
Chloride: 94 mmol/L — ABNORMAL LOW (ref 101–111)
Creatinine, Ser: 0.46 mg/dL — ABNORMAL LOW (ref 0.61–1.24)
Glucose, Bld: 133 mg/dL — ABNORMAL HIGH (ref 65–99)
Potassium: 3.8 mmol/L (ref 3.5–5.1)
SODIUM: 125 mmol/L — AB (ref 135–145)
TOTAL PROTEIN: 6.4 g/dL — AB (ref 6.5–8.1)

## 2016-05-30 LAB — CBC WITH DIFFERENTIAL/PLATELET
Basophils Absolute: 0 10*3/uL (ref 0.0–0.1)
Basophils Relative: 0 %
EOS ABS: 0.3 10*3/uL (ref 0.0–0.7)
Eosinophils Relative: 2 %
HCT: 35.5 % — ABNORMAL LOW (ref 39.0–52.0)
HEMOGLOBIN: 12.4 g/dL — AB (ref 13.0–17.0)
LYMPHS ABS: 1.2 10*3/uL (ref 0.7–4.0)
LYMPHS PCT: 11 %
MCH: 34.2 pg — AB (ref 26.0–34.0)
MCHC: 34.9 g/dL (ref 30.0–36.0)
MCV: 97.8 fL (ref 78.0–100.0)
MONOS PCT: 16 %
Monocytes Absolute: 1.8 10*3/uL — ABNORMAL HIGH (ref 0.1–1.0)
NEUTROS PCT: 71 %
Neutro Abs: 7.9 10*3/uL — ABNORMAL HIGH (ref 1.7–7.7)
Platelets: 130 10*3/uL — ABNORMAL LOW (ref 150–400)
RBC: 3.63 MIL/uL — ABNORMAL LOW (ref 4.22–5.81)
RDW: 17.8 % — ABNORMAL HIGH (ref 11.5–15.5)
WBC: 11.2 10*3/uL — ABNORMAL HIGH (ref 4.0–10.5)

## 2016-05-30 LAB — PROTIME-INR
INR: 3.13
Prothrombin Time: 32.9 seconds — ABNORMAL HIGH (ref 11.4–15.2)

## 2016-05-30 NOTE — ED Triage Notes (Signed)
                                                                                                                                                                                                                                                                                                                      [facility] and was d/c and went to PCP office today for follow up and was told to come to ED for follow up of continued juandice, possible elevated ammonia and urinary catheter still in place and stated they do not remove catheters. [facility] stated they called [facility] to give report and they told them they don't have urology today and to go to another hospital for eval.

## 2016-05-30 NOTE — ED Notes (Signed)
Pt states understanding of care given and follow up instructions.  Informed pt that if symptoms get worse or if there is increased confusion he should return to ED right away.  Pt a/o, taken from ED in wheelchair to be transported home by family

## 2016-05-30 NOTE — ED Provider Notes (Signed)
[facility]Y DEPT Provider Note   CSN: [id number] Arrival date & time: 05/30/16  1757     History   Chief Complaint Chief Complaint  Patient presents with  . Jaundice  . Groin Swelling    HPI Gwenyth BenderDavid W Celli is a 56 y.o. male.  HPI  56 year old male who presents for Foley catheter removal. He has a history of diabetes, alcoholic cirrhosis, prior CVA, and alcohol abuse. Although his chief complaints states jaundice and groin swelling, he states that this is baseline for him. He states that he was recently admitted to [facility] 1-2 weeks ago for confusion and increased swelling. He states that a Foley catheter was inserted due to significant scrotal swelling. States that he was diuresed, ended up being on antibiotics, and was discharged with instructions to return to his outpatient provider for Foley removal in 1 week. States that he has not had a follow-up appointment with his outpatient clinic, and came here to the ED for his Foley catheter removal today. States that his scrotal swelling and abdominal swelling is now baseline, and not worsened since discharge. Denies any fevers, chills, abdominal pain, cough, n/v, dyspnea or chest pain. States that he has diarrhea, but this is in the setting of initiating lactulose. No melena or hematochezia.  Past Medical History:  Diagnosis Date  . Cellulitis   . Diabetes mellitus without complication (HCC)   . Elevated liver enzymes   . Liver failure (HCC)   . Obesity 05/10/2013    Patient Active Problem List   Diagnosis Date Noted  . Acute CHF (HCC) 02/20/2016  . Ascites   . Cellulitis of scrotum 02/19/2016  . Liver cirrhosis, alcoholic (HCC) 02/19/2016  . Thrombocytopenia (HCC) 02/19/2016  . Hyperbilirubinemia 02/19/2016  . Abdominal pain, RUQ (right upper quadrant) 05/10/2013  . Chest pain 05/10/2013  . Diabetes (HCC) 05/10/2013  . Obesity 05/10/2013  . Alcohol abuse 05/10/2013  . RUQ abdominal pain 05/10/2013    Past  Surgical History:  Procedure Laterality Date  . HEMORROIDECTOMY         Home Medications    Prior to Admission medications   Medication Sig Start Date End Date Taking? Authorizing Provider  aspirin EC 81 MG tablet Take 81 mg by mouth daily.   Yes [provider]  esomeprazole (NEXIUM) 40 MG capsule Take 40 mg by mouth daily before breakfast.    Yes [provider]  glyBURIDE (DIABETA) 5 MG tablet Take 2.5 mg by mouth daily with breakfast.    Yes [provider]  spironolactone (ALDACTONE) 25 MG tablet Take 1 tablet (25 mg total) by mouth daily. 02/26/16  Yes Erick BlinksMemon, Jehanzeb, MD    Family History Family History  Problem Relation Age of Onset  . Diabetes Mother   . Cancer Father     Social History Social History  Substance Use Topics  . Smoking status: Never Smoker  . Smokeless tobacco: Never Used  . Alcohol use Yes     Comment: daily     Allergies   Morphine and related and Penicillins   Review of Systems Review of Systems  Constitutional: Negative for fever.  Respiratory: Negative for cough.   Cardiovascular: Negative for chest pain.  Gastrointestinal: Negative for abdominal pain.  Neurological: Negative for syncope and light-headedness.  Psychiatric/Behavioral: Negative for confusion.  All other systems reviewed and are negative.    Physical Exam Updated Vital Signs BP 113/61   Pulse (!) 104   Temp 98.7 F (37.1 C) (Oral)  Resp 15   Ht 6\' 2"  (1.88 m)   Wt 285 lb (129.3 kg)   SpO2 95%   BMI 36.59 kg/m   Physical Exam Physical Exam  Nursing note and vitals reviewed. Constitutional: chronically ill appearing, non-toxic, and in no acute distress Head: Normocephalic and atraumatic.  Mouth/Throat: Oropharynx is clear and moist.  Neck: Normal range of motion. Neck supple.  Cardiovascular: Normal rate and regular rhythm.   Pulmonary/Chest: Effort normal and breath sounds normal.  Abdominal: Soft. Moderate distension There is  no tenderness. There is no rebound and no guarding.  GU: moderate scrotal swelling without tenderness to palpation. Foley catheter in place. Musculoskeletal: Normal range of motion.  Neurological: Alert, no facial droop, fluent speech, moves all extremities symmetrically, no asterixis Skin: Skin is warm and dry. Jaundiced. Psychiatric: Cooperative   ED Treatments / Results  Labs (all labs ordered are listed, but only abnormal results are displayed) Labs Reviewed  CBC WITH DIFFERENTIAL/PLATELET - Abnormal; Notable for the following:       Result Value   WBC 11.2 (*)    RBC 3.63 (*)    Hemoglobin 12.4 (*)    HCT 35.5 (*)    MCH 34.2 (*)    RDW 17.8 (*)    Platelets 130 (*)    Neutro Abs 7.9 (*)    Monocytes Absolute 1.8 (*)    All other components within normal limits  COMPREHENSIVE METABOLIC PANEL - Abnormal; Notable for the following:    Sodium 125 (*)    Chloride 94 (*)    Glucose, Bld 133 (*)    Creatinine, Ser 0.46 (*)    Calcium 8.3 (*)    Total Protein 6.4 (*)    Albumin 1.6 (*)    AST 82 (*)    Alkaline Phosphatase 170 (*)    Total Bilirubin 12.2 (*)    All other components within normal limits  PROTIME-INR - Abnormal; Notable for the following:    Prothrombin Time 32.9 (*)    All other components within normal limits  URINALYSIS, ROUTINE W REFLEX MICROSCOPIC - Abnormal; Notable for the following:    Color, Urine AMBER (*)    APPearance HAZY (*)    Hgb urine dipstick LARGE (*)    Bilirubin Urine MODERATE (*)    Protein, ur 30 (*)    Bacteria, UA RARE (*)    Squamous Epithelial / LPF 0-5 (*)    All other components within normal limits    EKG  EKG Interpretation None       Radiology Dg Chest 2 View  Result Date: 05/30/2016 CLINICAL DATA:  Dyspnea. EXAM: CHEST  2 VIEW COMPARISON:  05/25/2016 and 05/05/2016 FINDINGS: Moderate left pleural effusion and left lower lobe compressive atelectasis show no significant interval change. No evidence of right-sided  pleural effusion or pneumothorax. Heart size remains normal. IMPRESSION: No significant change in moderate left pleural effusion and left lower lobe atelectasis. Electronically Signed   By: Myles Rosenthal M.D.   On: 05/30/2016 19:57    Procedures Procedures (including critical care time)  Medications Ordered in ED Medications - No data to display   Initial Impression / Assessment and Plan / ED Course  I have reviewed the triage vital signs and the nursing notes.  Pertinent labs & imaging results that were available during my care of the patient were reviewed by me and considered in my medical decision making (see chart for details).     Presenting with Foley catheter removal. He  is chronically ill-appearing, but nontoxic in no acute distress. He is no complaints, is overall he feels swelling has gone down from initial admission at [facility]. He is afebrile without any complaints of infectious symptoms. Has not been having any confusion. Blood work showing stable poor liver function. He is hyponatremic to 125, but I reviewed his records which He brought from [place], and his discharge sodium was 128 at the end of February. States that he is only here for catheter removal, and his Foley was removed. Discussed that he needs to have a repeat sodium performed in the next few days and close follow-up with his PCP or GI doctor. Referral given for GI in [place] as needed. Strict return instructions reviewed, including confusion, fever, worsening swelling or any other acute changes. He expressed understanding of all discharge instructions and felt comfortable with the plan of care.   Final Clinical Impressions(s) / ED Diagnoses   Final diagnoses:  Encounter for Foley catheter removal  Hyponatremia    New Prescriptions New Prescriptions   No medications on file     Lavera Guise, MD 05/30/16 2152

## 2016-05-30 NOTE — Discharge Instructions (Signed)
Please follow-up with your PCP or with GI next week for sodium re-check.  Return for worsening symptoms, including confusion, fever, weakness, worsening swelling, or any other symptoms concerning to you.

## 2016-06-03 ENCOUNTER — Encounter (HOSPITAL_COMMUNITY): Payer: Self-pay | Admitting: Cardiology

## 2016-06-03 ENCOUNTER — Inpatient Hospital Stay (HOSPITAL_COMMUNITY): Payer: Self-pay

## 2016-06-03 ENCOUNTER — Inpatient Hospital Stay (HOSPITAL_COMMUNITY)
Admission: EM | Admit: 2016-06-03 | Discharge: 2016-06-14 | DRG: 872 | Disposition: E | Payer: Self-pay | Attending: Family Medicine | Admitting: Family Medicine

## 2016-06-03 ENCOUNTER — Emergency Department (HOSPITAL_COMMUNITY): Payer: Self-pay

## 2016-06-03 DIAGNOSIS — Z833 Family history of diabetes mellitus: Secondary | ICD-10-CM

## 2016-06-03 DIAGNOSIS — Z9119 Patient's noncompliance with other medical treatment and regimen: Secondary | ICD-10-CM

## 2016-06-03 DIAGNOSIS — Z515 Encounter for palliative care: Secondary | ICD-10-CM

## 2016-06-03 DIAGNOSIS — A4189 Other specified sepsis: Principal | ICD-10-CM | POA: Diagnosis present

## 2016-06-03 DIAGNOSIS — A419 Sepsis, unspecified organism: Secondary | ICD-10-CM | POA: Diagnosis present

## 2016-06-03 DIAGNOSIS — E118 Type 2 diabetes mellitus with unspecified complications: Secondary | ICD-10-CM

## 2016-06-03 DIAGNOSIS — K7682 Hepatic encephalopathy: Secondary | ICD-10-CM | POA: Diagnosis present

## 2016-06-03 DIAGNOSIS — Z6837 Body mass index (BMI) 37.0-37.9, adult: Secondary | ICD-10-CM

## 2016-06-03 DIAGNOSIS — R680 Hypothermia, not associated with low environmental temperature: Secondary | ICD-10-CM | POA: Diagnosis present

## 2016-06-03 DIAGNOSIS — E119 Type 2 diabetes mellitus without complications: Secondary | ICD-10-CM

## 2016-06-03 DIAGNOSIS — E860 Dehydration: Secondary | ICD-10-CM | POA: Diagnosis present

## 2016-06-03 DIAGNOSIS — N5089 Other specified disorders of the male genital organs: Secondary | ICD-10-CM | POA: Diagnosis present

## 2016-06-03 DIAGNOSIS — D689 Coagulation defect, unspecified: Secondary | ICD-10-CM | POA: Diagnosis present

## 2016-06-03 DIAGNOSIS — E8809 Other disorders of plasma-protein metabolism, not elsewhere classified: Secondary | ICD-10-CM | POA: Diagnosis present

## 2016-06-03 DIAGNOSIS — Z7982 Long term (current) use of aspirin: Secondary | ICD-10-CM

## 2016-06-03 DIAGNOSIS — Z79899 Other long term (current) drug therapy: Secondary | ICD-10-CM

## 2016-06-03 DIAGNOSIS — E871 Hypo-osmolality and hyponatremia: Secondary | ICD-10-CM | POA: Diagnosis present

## 2016-06-03 DIAGNOSIS — N179 Acute kidney failure, unspecified: Secondary | ICD-10-CM | POA: Diagnosis present

## 2016-06-03 DIAGNOSIS — R6520 Severe sepsis without septic shock: Secondary | ICD-10-CM | POA: Diagnosis present

## 2016-06-03 DIAGNOSIS — R34 Anuria and oliguria: Secondary | ICD-10-CM | POA: Diagnosis present

## 2016-06-03 DIAGNOSIS — J90 Pleural effusion, not elsewhere classified: Secondary | ICD-10-CM | POA: Diagnosis present

## 2016-06-03 DIAGNOSIS — K7200 Acute and subacute hepatic failure without coma: Secondary | ICD-10-CM | POA: Diagnosis present

## 2016-06-03 DIAGNOSIS — R601 Generalized edema: Secondary | ICD-10-CM | POA: Diagnosis present

## 2016-06-03 DIAGNOSIS — D696 Thrombocytopenia, unspecified: Secondary | ICD-10-CM | POA: Diagnosis present

## 2016-06-03 DIAGNOSIS — D6959 Other secondary thrombocytopenia: Secondary | ICD-10-CM | POA: Diagnosis present

## 2016-06-03 DIAGNOSIS — K7030 Alcoholic cirrhosis of liver without ascites: Secondary | ICD-10-CM | POA: Diagnosis present

## 2016-06-03 DIAGNOSIS — Z7189 Other specified counseling: Secondary | ICD-10-CM

## 2016-06-03 DIAGNOSIS — K7031 Alcoholic cirrhosis of liver with ascites: Secondary | ICD-10-CM

## 2016-06-03 DIAGNOSIS — Z66 Do not resuscitate: Secondary | ICD-10-CM | POA: Diagnosis present

## 2016-06-03 DIAGNOSIS — Z7984 Long term (current) use of oral hypoglycemic drugs: Secondary | ICD-10-CM

## 2016-06-03 DIAGNOSIS — I959 Hypotension, unspecified: Secondary | ICD-10-CM | POA: Diagnosis present

## 2016-06-03 DIAGNOSIS — A0472 Enterocolitis due to Clostridium difficile, not specified as recurrent: Secondary | ICD-10-CM | POA: Diagnosis present

## 2016-06-03 DIAGNOSIS — K7040 Alcoholic hepatic failure without coma: Secondary | ICD-10-CM | POA: Diagnosis present

## 2016-06-03 LAB — COMPREHENSIVE METABOLIC PANEL
ALT: 45 U/L (ref 17–63)
ANION GAP: 9 (ref 5–15)
AST: 71 U/L — AB (ref 15–41)
Albumin: 1.5 g/dL — ABNORMAL LOW (ref 3.5–5.0)
Alkaline Phosphatase: 153 U/L — ABNORMAL HIGH (ref 38–126)
BILIRUBIN TOTAL: 14.8 mg/dL — AB (ref 0.3–1.2)
BUN: 49 mg/dL — AB (ref 6–20)
CHLORIDE: 95 mmol/L — AB (ref 101–111)
CO2: 21 mmol/L — ABNORMAL LOW (ref 22–32)
Calcium: 8.1 mg/dL — ABNORMAL LOW (ref 8.9–10.3)
Creatinine, Ser: 1.34 mg/dL — ABNORMAL HIGH (ref 0.61–1.24)
GFR, EST NON AFRICAN AMERICAN: 58 mL/min — AB (ref >60)
Glucose, Bld: 143 mg/dL — ABNORMAL HIGH (ref 65–99)
POTASSIUM: 3.8 mmol/L (ref 3.5–5.1)
Sodium: 125 mmol/L — ABNORMAL LOW (ref 135–145)
TOTAL PROTEIN: 6.1 g/dL — AB (ref 6.5–8.1)

## 2016-06-03 LAB — CBC WITH DIFFERENTIAL/PLATELET
BASOS PCT: 0 %
Basophils Absolute: 0 10*3/uL (ref 0.0–0.1)
EOS ABS: 0.1 10*3/uL (ref 0.0–0.7)
EOS PCT: 1 %
HCT: 31.8 % — ABNORMAL LOW (ref 39.0–52.0)
Hemoglobin: 11.2 g/dL — ABNORMAL LOW (ref 13.0–17.0)
LYMPHS ABS: 1 10*3/uL (ref 0.7–4.0)
Lymphocytes Relative: 5 %
MCH: 34.6 pg — AB (ref 26.0–34.0)
MCHC: 35.2 g/dL (ref 30.0–36.0)
MCV: 98.1 fL (ref 78.0–100.0)
Monocytes Absolute: 1.7 10*3/uL — ABNORMAL HIGH (ref 0.1–1.0)
Monocytes Relative: 9 %
Neutro Abs: 15.6 10*3/uL — ABNORMAL HIGH (ref 1.7–7.7)
Neutrophils Relative %: 85 %
PLATELETS: 135 10*3/uL — AB (ref 150–400)
RBC: 3.24 MIL/uL — ABNORMAL LOW (ref 4.22–5.81)
RDW: 18.6 % — ABNORMAL HIGH (ref 11.5–15.5)
WBC: 18.4 10*3/uL — AB (ref 4.0–10.5)

## 2016-06-03 LAB — URINALYSIS, ROUTINE W REFLEX MICROSCOPIC
GLUCOSE, UA: 50 mg/dL — AB
KETONES UR: NEGATIVE mg/dL
Nitrite: NEGATIVE
PROTEIN: 100 mg/dL — AB
Specific Gravity, Urine: 1.018 (ref 1.005–1.030)
pH: 5 (ref 5.0–8.0)

## 2016-06-03 LAB — MRSA PCR SCREENING: MRSA BY PCR: NEGATIVE

## 2016-06-03 LAB — LACTIC ACID, PLASMA
Lactic Acid, Venous: 3.4 mmol/L (ref 0.5–1.9)
Lactic Acid, Venous: 3.5 mmol/L (ref 0.5–1.9)

## 2016-06-03 LAB — PROTIME-INR
INR: 3.66
Prothrombin Time: 37.3 seconds — ABNORMAL HIGH (ref 11.4–15.2)

## 2016-06-03 LAB — GLUCOSE, CAPILLARY
GLUCOSE-CAPILLARY: 129 mg/dL — AB (ref 65–99)
Glucose-Capillary: 146 mg/dL — ABNORMAL HIGH (ref 65–99)

## 2016-06-03 LAB — I-STAT CG4 LACTIC ACID, ED: LACTIC ACID, VENOUS: 3.78 mmol/L — AB (ref 0.5–1.9)

## 2016-06-03 LAB — CBG MONITORING, ED: GLUCOSE-CAPILLARY: 132 mg/dL — AB (ref 65–99)

## 2016-06-03 LAB — CORTISOL: Cortisol, Plasma: 23.5 ug/dL

## 2016-06-03 LAB — PROCALCITONIN: PROCALCITONIN: 0.71 ng/mL

## 2016-06-03 LAB — BRAIN NATRIURETIC PEPTIDE: B NATRIURETIC PEPTIDE 5: 356 pg/mL — AB (ref 0.0–100.0)

## 2016-06-03 LAB — AMMONIA: AMMONIA: 136 umol/L — AB (ref 9–35)

## 2016-06-03 MED ORDER — SODIUM CHLORIDE 0.9 % IV BOLUS (SEPSIS): 1000.0000 mL | Freq: Once | INTRAVENOUS | Status: AC

## 2016-06-03 MED ORDER — SODIUM CHLORIDE 0.9 % IV BOLUS (SEPSIS)
2000.0000 mL | Freq: Once | INTRAVENOUS | Status: AC
  Administered 2016-06-03: 2000 mL via INTRAVENOUS

## 2016-06-03 MED ORDER — LEVOFLOXACIN IN D5W 750 MG/150ML IV SOLN: 750.0000 mg | INTRAVENOUS | Status: DC

## 2016-06-03 MED ORDER — DEXTROSE 5 % IV SOLN
10.0000 mg | Freq: Every day | INTRAVENOUS | Status: AC
  Administered 2016-06-03 – 2016-06-05 (×3): 10 mg via INTRAVENOUS
  Filled 2016-06-03 (×4): qty 1

## 2016-06-03 MED ORDER — SODIUM CHLORIDE 0.9 % IV SOLN
INTRAVENOUS | Status: DC
  Administered 2016-06-03 – 2016-06-05 (×3): via INTRAVENOUS
  Administered 2016-06-05: 1000 mL via INTRAVENOUS

## 2016-06-03 MED ORDER — LEVOFLOXACIN IN D5W 750 MG/150ML IV SOLN
750.0000 mg | Freq: Once | INTRAVENOUS | Status: AC
  Administered 2016-06-03: 750 mg via INTRAVENOUS
  Filled 2016-06-03: qty 150

## 2016-06-03 MED ORDER — VANCOMYCIN HCL IN DEXTROSE 1-5 GM/200ML-% IV SOLN: 1000.0000 mg | Freq: Once | INTRAVENOUS | Status: DC

## 2016-06-03 MED ORDER — VANCOMYCIN HCL IN DEXTROSE 1-5 GM/200ML-% IV SOLN
1000.0000 mg | Freq: Three times a day (TID) | INTRAVENOUS | Status: DC
  Administered 2016-06-03 – 2016-06-04 (×2): 1000 mg via INTRAVENOUS
  Filled 2016-06-03 (×2): qty 200

## 2016-06-03 MED ORDER — VANCOMYCIN HCL IN DEXTROSE 1-5 GM/200ML-% IV SOLN
1000.0000 mg | Freq: Once | INTRAVENOUS | Status: AC
  Administered 2016-06-03: 1000 mg via INTRAVENOUS
  Filled 2016-06-03: qty 200

## 2016-06-03 MED ORDER — AZTREONAM 2 G IJ SOLR
2.0000 g | Freq: Once | INTRAMUSCULAR | Status: AC
  Administered 2016-06-03: 2 g via INTRAVENOUS
  Filled 2016-06-03: qty 2

## 2016-06-03 MED ORDER — SODIUM CHLORIDE 0.9 % IV BOLUS (SEPSIS)
1000.0000 mL | Freq: Once | INTRAVENOUS | Status: AC
  Administered 2016-06-03: 1000 mL via INTRAVENOUS

## 2016-06-03 MED ORDER — SODIUM CHLORIDE 0.9 % IV BOLUS (SEPSIS)
1000.0000 mL | Freq: Once | INTRAVENOUS | Status: AC
Start: 2016-06-03 — End: 2016-06-03

## 2016-06-03 MED ORDER — ACETAMINOPHEN 650 MG RE SUPP
650.0000 mg | Freq: Four times a day (QID) | RECTAL | Status: DC | PRN
  Filled 2016-06-03: qty 1

## 2016-06-03 MED ORDER — ONDANSETRON HCL 4 MG/2ML IJ SOLN
4.0000 mg | Freq: Four times a day (QID) | INTRAMUSCULAR | Status: DC | PRN
  Filled 2016-06-03: qty 2

## 2016-06-03 MED ORDER — PIPERACILLIN-TAZOBACTAM 3.375 G IVPB 30 MIN
3.3750 g | Freq: Once | INTRAVENOUS | Status: AC
  Administered 2016-06-03: 3.375 g via INTRAVENOUS
  Filled 2016-06-03: qty 50

## 2016-06-03 MED ORDER — DEXTROSE 5 % IV SOLN
1.0000 g | Freq: Three times a day (TID) | INTRAVENOUS | Status: DC
  Administered 2016-06-04 (×2): 1 g via INTRAVENOUS
  Filled 2016-06-03 (×2): qty 1

## 2016-06-03 MED ORDER — ACETAMINOPHEN 325 MG PO TABS
650.0000 mg | ORAL_TABLET | Freq: Four times a day (QID) | ORAL | Status: DC | PRN
  Filled 2016-06-03: qty 2

## 2016-06-03 MED ORDER — ALBUMIN HUMAN 25 % IV SOLN
25.0000 g | Freq: Two times a day (BID) | INTRAVENOUS | Status: AC
  Administered 2016-06-03 – 2016-06-04 (×4): 25 g via INTRAVENOUS
  Filled 2016-06-03 (×4): qty 100

## 2016-06-03 MED ORDER — LACTULOSE 10 GM/15ML PO SOLN
30.0000 g | Freq: Three times a day (TID) | ORAL | Status: DC
  Administered 2016-06-03 (×2): 30 g via ORAL
  Filled 2016-06-03 (×2): qty 60

## 2016-06-03 MED ORDER — ONDANSETRON HCL 4 MG PO TABS: 4.0000 mg | ORAL_TABLET | Freq: Four times a day (QID) | ORAL | Status: DC | PRN

## 2016-06-03 MED ORDER — SODIUM CHLORIDE 0.9 % IV BOLUS (SEPSIS)
750.0000 mL | Freq: Once | INTRAVENOUS | Status: AC
  Administered 2016-06-03: 750 mL via INTRAVENOUS

## 2016-06-03 MED ORDER — VANCOMYCIN HCL 10 G IV SOLR
2000.0000 mg | Freq: Once | INTRAVENOUS | Status: AC
  Administered 2016-06-03: 2000 mg via INTRAVENOUS
  Filled 2016-06-03: qty 2000

## 2016-06-03 MED ORDER — INSULIN ASPART 100 UNIT/ML ~~LOC~~ SOLN
0.0000 [IU] | SUBCUTANEOUS | Status: DC
  Administered 2016-06-03 – 2016-06-04 (×3): 1 [IU] via SUBCUTANEOUS
  Administered 2016-06-04 (×3): 2 [IU] via SUBCUTANEOUS

## 2016-06-03 NOTE — ED Notes (Signed)
Pt incontinent of stool.  Cleaned pt up.  Pt edematous all over.

## 2016-06-03 NOTE — ED Notes (Signed)
Rectal temp of 93.9. Dr. Estell HarpinZammit made aware. Bear hugger placed on pt.

## 2016-06-03 NOTE — ED Notes (Signed)
CRITICAL VALUE ALERT  Critical value received:  Lactic Acid 3.78  Date of notification:  10/10/16  Time of notification:  1035  Critical value read back:Yes.    Nurse who received alert:  RMinter, RN  MD notified (1st page):  Dr. Estell HarpinZammit  Time of first page:  1035  MD notified (2nd page):  Time of second page:  Responding MD:  Ronalee BeltsMinter, RN  Time MD responded:  [phone]

## 2016-06-03 NOTE — Consult Note (Signed)
Consultation Note Date: 05/30/2016   Patient Name: [name]  DOB: 08/01/1960  MRN: [id number]  Age / Sex: 56 y.o., male  PCP: [facility] Referring Physician: Erick Blinks, MD  Reason for Consultation: Establishing goals of care and Psychosocial/spiritual support  HPI/Patient Profile: 56 y.o. male  with past medical history of Cellulitis, obesity, liver failure, 6 to 7 hospital admissions in the last 3 months admitted on 05/25/2016 with sepsis, etiology unclear, acute hepatic encephalopathy.   Clinical Assessment and Goals of Care: [name] is lying in bed, PICC nurse at bedside. Limited interaction due to need for sterile environment.   Family meeting with wife, Kaushal Vannice, and sons Earna Coder and Thayer Ohm (wife Ashlyn). We talk about Mr. gives chronic and acute health problems. I share a diagram of the chronic illness pathway, what is normal and expected. I discussed the current treatment plan of albumin, lactulose, PIC line. I share my worry over [name] multiple hospitalizations in the last few months. I encourage family for watchful waiting, 24/48 hours for signs of improvement if [name] is able.  We discuss healthcare power of attorney and code status. I share that I will meet with family tomorrow afternoon for updates. I share that the medical team is worried, and we know they are also, but that we are all doing our best.  Healthcare power of attorney NEXT OF KIN - wife Loyd Marhefka , Limited  SUMMARY OF RECOMMENDATIONS   24 to 48 hours for improvement/ decline  Code Status/Advance Care Planning:  DNR - we discussed the concept of allowing natural death, sons Thayer Ohm and Earna Coder present  Symptom Management:   per hospitalist, no additional needs at this time  Palliative Prophylaxis:   Aspiration, Oral Care and Turn Reposition  Additional Recommendations  (Limitations, Scope, Preferences):  Continue to treat the treatable for 24/48 hours, no CPR, no intubation  Psycho-social/Spiritual:   Desire for further Chaplaincy support:no  Additional Recommendations: Caregiving  Support/Resources and Education on Hospice  Prognosis:   < 2 weeks, would not be surprising based on sepsis, functional decline, frailty, 6 to 7 hospitalizations in the last 3 months.  Discharge Planning: To be determined, based on outcomes.      Primary Diagnoses: Present on Admission: . Sepsis (HCC) . Thrombocytopenia (HCC) . Liver cirrhosis, alcoholic (HCC) . Hyperbilirubinemia . Coagulopathy (HCC) . Anasarca . Pleural effusion on left . Acute hepatic encephalopathy . Hypoalbuminemia . Hyponatremia   I have reviewed the medical record, interviewed the patient and family, and examined the patient. The following aspects are pertinent.  Past Medical History:  Diagnosis Date  . Cellulitis   . Diabetes mellitus without complication (HCC)   . Elevated liver enzymes   . Liver failure (HCC)   . Obesity 05/10/2013   Social History   Social History  . Marital status: Married    Spouse name: N/A  . Number of children: N/A  . Years of education: N/A   Social History Main Topics  . Smoking status: Never  Smoker  . Smokeless tobacco: Never Used  . Alcohol use Yes     Comment: daily  . Drug use: No  . Sexual activity: Not Asked   Other Topics Concern  . None   Social History Narrative  . None   Family History  Problem Relation Age of Onset  . Diabetes Mother   . Cancer Father    Scheduled Meds: . insulin aspart  0-9 Units Subcutaneous Q4H  . lactulose  30 g Oral TID   Continuous Infusions: . sodium chloride 100 mL/hr at 01-Mar-2016 1401  . albumin human    . [START ON 06/04/2016] aztreonam    . [START ON 06/04/2016] levofloxacin (LEVAQUIN) IV    . phytonadione (VITAMIN K) IV Stopped (01-Mar-2016 1520)  . vancomycin 2,000 mg (01-Mar-2016 1616)  .  vancomycin     PRN Meds:.acetaminophen **OR** acetaminophen, ondansetron **OR** ondansetron (ZOFRAN) IV Medications Prior to Admission:  Prior to Admission medications   Medication Sig Start Date End Date Taking? Authorizing Provider  aspirin EC 81 MG tablet Take 81 mg by mouth daily.   Yes [provider]  esomeprazole (NEXIUM) 40 MG capsule Take 40 mg by mouth daily before breakfast.    Yes [provider]  glyBURIDE (DIABETA) 5 MG tablet Take 2.5 mg by mouth daily with breakfast.    Yes [provider]  spironolactone (ALDACTONE) 25 MG tablet Take 1 tablet (25 mg total) by mouth daily. 02/26/16  Yes Erick BlinksMemon, Jehanzeb, MD   Allergies  Allergen Reactions  . Morphine And Related Nausea And Vomiting  . Penicillins Nausea And Vomiting and Other (See Comments)    Has patient had a PCN reaction causing immediate rash, facial/tongue/throat swelling, SOB or lightheadedness with hypotension: No Has patient had a PCN reaction causing severe rash involving mucus membranes or skin necrosis: No Has patient had a PCN reaction that required hospitalization: No Has patient had a PCN reaction occurring within the last 10 years: No If all of the above answers are "NO", then may proceed with Cephalosporin use.   Review of Systems  Unable to perform ROS: Acuity of condition    Physical Exam  HENT:  Head: Normocephalic and atraumatic.  Cardiovascular: Normal rate and regular rhythm.   Rate 110's at times  Pulmonary/Chest: Effort normal. No respiratory distress.  Abdominal: Soft. He exhibits distension.  Musculoskeletal:  Anasarca  Neurological:  Unable to determine orientation  Skin: Skin is warm and dry.  Jaundice  Nursing note and vitals reviewed.   Vital Signs: BP (!) 98/57   Pulse (!) 104   Temp (!) 96.9 F (36.1 C) (Oral)   Resp (!) 21   Ht 6\' 2"  (1.88 m)   Wt 132.9 kg (292 lb 15.9 oz)   SpO2 92%   BMI 37.62 kg/m  Pain Assessment: PAINAD   Pain Score: 5      SpO2: SpO2: 92 % O2 Device:SpO2: 92 % O2 Flow Rate: .O2 Flow Rate (L/min): 2 L/min  IO: Intake/output summary:  Intake/Output Summary (Last 24 hours) at 01-Mar-2016 1721 Last data filed at 01-Mar-2016 1500  Gross per 24 hour  Intake         11138.33 ml  Output                0 ml  Net         11138.33 ml    LBM: Last BM Date: 01-Mar-2016 Baseline Weight: Weight: 129.3 kg (285 lb) Most recent weight: Weight: 132.9 kg (292  lb 15.9 oz)     Palliative Assessment/Data:   Flowsheet Rows     Most Recent Value  Intake Tab  Referral Department  Hospitalist  Unit at Time of Referral  ICU  Palliative Care Primary Diagnosis  Sepsis/Infectious Disease  Date Notified  06-18-16  Palliative Care Type  New Palliative care  Reason for referral  Clarify Goals of Care  Date of Admission  2016/06/18  Date first seen by Palliative Care  2016-06-18  # of days Palliative referral response time  0 Day(s)  # of days IP prior to Palliative referral  0  Clinical Assessment  Palliative Performance Scale Score  20%  Pain Max last 24 hours  Not able to report  Pain Min Last 24 hours  Not able to report  Dyspnea Max Last 24 Hours  Not able to report  Dyspnea Min Last 24 hours  Not able to report  Psychosocial & Spiritual Assessment  Palliative Care Outcomes  Patient/Family meeting held?  Yes  Who was at the meeting?  Wife Myriam Jacobson and sons Gaylyn Rong and  Hayden  Palliative Care Outcomes  Provided advance care planning, Provided psychosocial or spiritual support, Clarified goals of care  Patient/Family wishes: Interventions discontinued/not started   Mechanical Ventilation      Time In: 1615 Time Out: 1705 Time Total: 50 minutes Greater than 50%  of this time was spent counseling and coordinating care related to the above assessment and plan.  Signed by: Katheran Awe, NP   Please contact Palliative Medicine Team phone at [phone] for questions and concerns.  For individual provider: See Loretha Stapler

## 2016-06-03 NOTE — H&P (Signed)
History and Physical    Gwenyth BenderDavid W Weidler ZOX:[id number] DOB: 10/27/1960 DOA: 04/04/2016  PCP: [facility], [facility]  Patient coming from: home  I have personally briefly reviewed patient's old medical records [facility]  Chief Complaint: altered mental status  HPI: Gwenyth BenderDavid W Vasko is a 56 y.o. male with medical history significant of alcoholic liver cirrhosis, diabetes, is brought to the hospital today with altered mental status. Patient was last admitted [facility] in 02/2016 at which time he was treated anasarca and scrotal cellulitis. His wife reports that since that discharge, he had been admitted between 4 and 5 times to [facility]. He was recently released from [facility] a few weeks back with a Foley catheter in place since he had significant scrotal edema and was unable to pass urine. He had [facility] at [facility] on 5/17 to have his Foley catheter removed as he reported that his swelling was better. Wife reports that since his Foley catheter had been removed, he became increasingly lethargic and agitated. She is unaware of any fevers. She also admits that he has been refusing his lactulose for several days now. His by mouth intake has been poor. She also feels that his overall anasarca is worse. He's had diarrhea, 6-7 bowel movements every day despite refusing lactulose. He has not had any vomiting. He has not complained of shortness of breath.  ED Course: In the emergency room, patient was noted to be hypothermic with a rectal temperature of 93.9. He was also noted to be hypotensive with a systolic blood pressure in the 80s. Lab work indicated a W BC out of 18,000. He was also noted to have a rising creatinine above baseline to 1.3. Bilirubin was markedly elevated at 14. INR elevated at 3.6. Lactic acid is significantly elevated at 3.78. It was felt that patient may have sepsis and was started on IV fluids and antibiotics. He's been  referred for admission.  Review of Systems: As per HPI otherwise 10 point review of systems negative.    Past Medical History:  Diagnosis Date  . Cellulitis   . Diabetes mellitus without complication (HCC)   . Elevated liver enzymes   . Liver failure (HCC)   . Obesity 05/10/2013    Past Surgical History:  Procedure Laterality Date  . HEMORROIDECTOMY       reports that he has never smoked. He has never used smokeless tobacco. He reports that he drinks alcohol. He reports that he does not use drugs.  Allergies  Allergen Reactions  . Morphine And Related Nausea And Vomiting  . Penicillins Nausea And Vomiting and Other (See Comments)    Has patient had a PCN reaction causing immediate rash, facial/tongue/throat swelling, SOB or lightheadedness with hypotension: No Has patient had a PCN reaction causing severe rash involving mucus membranes or skin necrosis: No Has patient had a PCN reaction that required hospitalization: No Has patient had a PCN reaction occurring within the last 10 years: No If all of the above answers are "NO", then may proceed with Cephalosporin use.    Family History  Problem Relation Age of Onset  . Diabetes Mother   . Cancer Father     Prior to Admission medications   Medication Sig Start Date End Date Taking? Authorizing Provider  aspirin EC 81 MG tablet Take 81 mg by mouth daily.   Yes [provider]  esomeprazole (NEXIUM) 40 MG capsule Take 40 mg by mouth  daily before breakfast.    Yes [provider]  glyBURIDE (DIABETA) 5 MG tablet Take 2.5 mg by mouth daily with breakfast.    Yes [provider]  spironolactone (ALDACTONE) 25 MG tablet Take 1 tablet (25 mg total) by mouth daily. 02/26/16  Yes Erick Blinks, MD    Physical Exam: Vitals:   05/21/2016 1030 05/21/2016 1100 06/02/2016 1115 05/23/2016 1219  BP: (!) 82/46 (!) 94/43  (!) 95/46  Pulse: 94 95 96 100  Resp: 19 18 18 20   Temp:    (!) 96.8 F (36 C)  TempSrc:     Rectal  SpO2: 95% 94% 96% 94%  Weight:        Constitutional: NAD, appears chronically ill Vitals:   05/14/2016 1030 06/04/2016 1100 05/22/2016 1115 05/26/2016 1219  BP: (!) 82/46 (!) 94/43  (!) 95/46  Pulse: 94 95 96 100  Resp: 19 18 18 20   Temp:    (!) 96.8 F (36 C)  TempSrc:    Rectal  SpO2: 95% 94% 96% 94%  Weight:       Eyes: PERRL, lids and conjunctivae normal ENMT: Mucous membranes are moist. Posterior pharynx clear of any exudate or lesions.Normal dentition.  Neck: normal, supple, no masses, no thyromegaly Respiratory:crackles bilaterally. Normal respiratory effort. No accessory muscle use.  Cardiovascular: Regular rate and rhythm, no murmurs / rubs / gallops. 2-3+extremity edema with anasarca. 2+ pedal pulses. No carotid bruits.  Abdomen: no tenderness, no masses palpated. Abdomen is distended. No hepatosplenomegaly. Bowel sounds positive.  Musculoskeletal: no clubbing / cyanosis. No joint deformity upper and lower extremities. Good ROM, no contractures. Normal muscle tone.  Skin: deeply jaundiced Neurologic: unable to assess due to mental status Psychiatric: lethargic    Labs on Admission: I have personally reviewed following labs and imaging studies  CBC:  Recent Labs Lab 05/30/16 2016 05/28/2016 1031  WBC 11.2* 18.4*  NEUTROABS 7.9* 15.6*  HGB 12.4* 11.2*  HCT 35.5* 31.8*  MCV 97.8 98.1  PLT 130* 135*   Basic Metabolic Panel:  Recent Labs Lab 05/30/16 2016 05/31/2016 1031  NA 125* 125*  K 3.8 3.8  CL 94* 95*  CO2 25 21*  GLUCOSE 133* 143*  BUN 17 49*  CREATININE 0.46* 1.34*  CALCIUM 8.3* 8.1*   GFR: Estimated Creatinine Clearance: 89 mL/min (A) (by C-G formula based on SCr of 1.34 mg/dL (H)). Liver Function Tests:  Recent Labs Lab 05/30/16 2016 06/04/2016 1031  AST 82* 71*  ALT 42 45  ALKPHOS 170* 153*  BILITOT 12.2* 14.8*  PROT 6.4* 6.1*  ALBUMIN 1.6* 1.5*   No results for input(s): LIPASE, AMYLASE in the last 168 hours.  Recent Labs Lab  05/21/2016 1031  AMMONIA 136*   Coagulation Profile:  Recent Labs Lab 05/30/16 2016 06/02/2016 1032  INR 3.13 3.66   Cardiac Enzymes: No results for input(s): CKTOTAL, CKMB, CKMBINDEX, TROPONINI in the last 168 hours. BNP (last 3 results) No results for input(s): PROBNP in the last 8760 hours. HbA1C: No results for input(s): HGBA1C in the last 72 hours. CBG:  Recent Labs Lab 06/02/2016 1018  GLUCAP 132*   Lipid Profile: No results for input(s): CHOL, HDL, LDLCALC, TRIG, CHOLHDL, LDLDIRECT in the last 72 hours. Thyroid Function Tests: No results for input(s): TSH, T4TOTAL, FREET4, T3FREE, THYROIDAB in the last 72 hours. Anemia Panel: No results for input(s): VITAMINB12, FOLATE, FERRITIN, TIBC, IRON, RETICCTPCT in the last 72 hours. Urine analysis:    Component Value Date/Time   COLORURINE AMBER (  A) 05/30/2016 2015   APPEARANCEUR HAZY (A) 05/30/2016 2015   LABSPEC 1.028 05/30/2016 2015   PHURINE 5.0 05/30/2016 2015   GLUCOSEU NEGATIVE 05/30/2016 2015   HGBUR LARGE (A) 05/30/2016 2015   BILIRUBINUR MODERATE (A) 05/30/2016 2015   KETONESUR NEGATIVE 05/30/2016 2015   PROTEINUR 30 (A) 05/30/2016 2015   NITRITE NEGATIVE 05/30/2016 2015   LEUKOCYTESUR NEGATIVE 05/30/2016 2015    Radiological Exams on Admission: Dg Chest Portable 1 View  Result Date: 05/17/2016 CLINICAL DATA:  Weakness.  Sepsis. EXAM: PORTABLE CHEST 1 VIEW COMPARISON:  05/30/2016. FINDINGS: Cardiomegaly with pulmonary vascular prominence and bilateral interstitial prominence with prominent left pleural effusion. Congestive heart failure could present this fashion. Bilateral pneumonitis cannot be excluded. It should be noted at the left-sided pleural effusion is increased significantly from prior exam. No pneumothorax. IMPRESSION: 1. Cardiomegaly with pulmonary vascular prominence and bilateral interstitial prominence with prominent left-sided pleural effusion. Congestive heart failure could present this fashion.  Bilateral pneumonitis cannot be excluded. 2. Should be noted that the left-sided pleural effusion is progressed significantly from prior exam. Electronically Signed   By: Maisie Fus  Register   On: 05/18/2016 10:52    Assessment/Plan Active Problems:   Diabetes (HCC)   Liver cirrhosis, alcoholic (HCC)   Thrombocytopenia (HCC)   Hyperbilirubinemia   Sepsis (HCC)   Coagulopathy (HCC)   Anasarca   Pleural effusion on left   Acute hepatic encephalopathy   Hypoalbuminemia   Hyponatremia     1. Sepsis. Etiology is unclear. He's been started on broad-spectrum antibiotics. Blood cultures have been sent. Chest x-ray indicates possible pneumonia although he's not had any significant cough. Since he is having frequent diarrhea, will check stool for C. difficile. Urine evaluations have been ordered, but he has not made any urine thus far. 2. Acute hepatic encephalopathy. Likely related to noncompliance with lactulose as well as dehydration from sepsis. Start on lactulose 3. Alcoholic liver cirrhosis. Bilirubin is markedly elevated. Part of this may be reactive to underlying sepsis. Continue to monitor. 4. Left pleural effusion. This is a recurrent effusion. He does not appear to be markedly short of breath at this time. With elevated INR, thoracentesis would be high risk. Discussed with radiology and plan is to reevaluate this at this overall condition stabilizes tomorrow. 5. Hyponatremia. Suspect this is related to cirrhosis. Continue on IV fluids and recheck in a.m . 6. Hypoalbuminemia. Related to cirrhosis. Likely causing anasarca pleural effusion. Start on IV albumin infusions. 7. Coagulopathy related to cirrhosis. Will start on daily vitamin K. 8. Thrombocytopenia. Related to cirrhosis. Continue to monitor. 9. Diabetes. Hold oral agents. Start on sliding scale insulin. 10. Discussion . I have had a lengthy discussion with the patient's daughter and mother at the bedside. We discussed the patient's  current critical illness, multiple medical problems and readmissions to the hospital, poor prognosis. They agreed to a DO NOT RESUSCITATE status. They also agreed that if the patient does not stabilize/improvement in 24 hours, they would consider comfort measures.  DVT prophylaxis: scd Code Status: DNR, if fails to improve/decompensates further, will transition to comfort Family Communication: discussed with wife and mother at the bedside Disposition Plan: pending hospital course Consults called: palliative care Admission status: inpatient, stepdown  Critical care time:  Ritu Gagliardo MD [facility] Pager [phone]  If 7PM-7AM, please contact night-coverage [email or web address] Password [email or web address]  05/23/2016, 1:09 PM

## 2016-06-03 NOTE — ED Provider Notes (Signed)
[facility] Provider Note   CSN: [id number] Arrival date & time: 03-08-2016  [id number]  By signing my name below, I, [name], attest that this documentation has been prepared under the direction and in the presence of [name], Jesus Hillmer, MD. Electronically Signed: Marnette Burgessyan Andrew [name], Scribe. May 09, 2016. 10:23 AM.  History   Chief Complaint Chief Complaint  Patient presents with  . Altered Mental Status    LEVEL 5 CAVEAT D/T ALTERED MENTAL STATUS   The history is provided by medical records. The history is limited by the condition of the patient. No language interpreter was used.  Altered Mental Status   The current episode started 1 to 2 hours ago. The problem has not changed since onset.Associated symptoms include confusion. Risk factors include alcohol intake. His past medical history is significant for diabetes and liver disease.   HPI Comments:  [name] is a morbidly obese 56 y.o. male with a PMHx of DM, Cellulitis, Liver Failure, and daily EtOH use, who presents to the Emergency Department by way of ambulance for sudden onset altered mental status this morning. Per EMS, pt was "talking out of his head" and showing signs of confusion PTA. Pt appears jaundiced and had a rectal temperature of 93.9 in the ED. Per chart review, pt was seen at [facility] four days ago for removal of a foley catheter. Per Dr. Theodora BlowLiu's note, "pt states that he was recently admitted to [facility] 1-2 weeks ago for confusion and increased swelling. He states that a Foley catheter was inserted due to significant scrotal swelling. States that he was diuresed, ended up being on antibiotics, and was discharged with instructions to return to his outpatient provider for Foley removal in 1 week. States that he has not had a follow-up appointment with his outpatient clinic, and came here to the ED for his Foley catheter removal today. States that his scrotal swelling and abdominal swelling is now baseline, and not  worsened since discharge."   Past Medical History:  Diagnosis Date  . Cellulitis   . Diabetes mellitus without complication (HCC)   . Elevated liver enzymes   . Liver failure (HCC)   . Obesity 05/10/2013   Patient Active Problem List   Diagnosis Date Noted  . Acute CHF (HCC) 02/20/2016  . Ascites   . Cellulitis of scrotum 02/19/2016  . Liver cirrhosis, alcoholic (HCC) 02/19/2016  . Thrombocytopenia (HCC) 02/19/2016  . Hyperbilirubinemia 02/19/2016  . Abdominal pain, RUQ (right upper quadrant) 05/10/2013  . Chest pain 05/10/2013  . Diabetes (HCC) 05/10/2013  . Obesity 05/10/2013  . Alcohol abuse 05/10/2013  . RUQ abdominal pain 05/10/2013   Past Surgical History:  Procedure Laterality Date  . HEMORROIDECTOMY      Home Medications    Prior to Admission medications   Medication Sig Start Date End Date Taking? Authorizing Provider  aspirin EC 81 MG tablet Take 81 mg by mouth daily.    [provider]  esomeprazole (NEXIUM) 40 MG capsule Take 40 mg by mouth daily before breakfast.     [provider]  glyBURIDE (DIABETA) 5 MG tablet Take 2.5 mg by mouth daily with breakfast.     [provider]  spironolactone (ALDACTONE) 25 MG tablet Take 1 tablet (25 mg total) by mouth daily. 02/26/16   Erick BlinksMemon, Jehanzeb, MD    Family History Family History  Problem Relation Age of Onset  . Diabetes Mother   . Cancer Father     Social History Social History  Substance Use Topics  . Smoking status: Never Smoker  . Smokeless tobacco: Never Used  . Alcohol use Yes     Comment: daily   Allergies   Morphine and related and Penicillins   Review of Systems Review of Systems  Unable to perform ROS: Mental status change  Psychiatric/Behavioral: Positive for confusion.   Physical Exam Updated Vital Signs BP (!) 94/43   Pulse 95   Temp (!) 93.9 F (34.4 C) (Rectal)   Resp 18   Wt 285 lb (129.3 kg)   SpO2 94%   BMI 36.59 kg/m   Physical Exam    Constitutional: He appears well-developed.  HENT:  Head: Normocephalic.  Eyes: EOM are normal. No scleral icterus.  Severe jaundice.   Neck: Neck supple. No thyromegaly present.  Cardiovascular: Normal rate and regular rhythm.  Exam reveals no gallop and no friction rub.   No murmur heard. 3+ edema in bilateral legs up the knees.   Pulmonary/Chest: No stridor. He has no wheezes. He has rales. He exhibits no tenderness.  Mild crackles in his lungs.   Abdominal: He exhibits no distension. There is no tenderness. There is no rebound.  Musculoskeletal: Normal range of motion. He exhibits no edema.  Lymphadenopathy:    He has no cervical adenopathy.  Neurological: He exhibits normal muscle tone.  Pt is awake, oriented to person and time. Little bit lethargic.   Skin: No rash noted. No erythema.  Severe jaundice.   Psychiatric: He has a normal mood and affect. His behavior is normal.    ED Treatments / Results  DIAGNOSTIC STUDIES:  Oxygen Saturation is 92% on [place] 4L, low by my interpretation.    Labs (all labs ordered are listed, but only abnormal results are displayed) Labs Reviewed  COMPREHENSIVE METABOLIC PANEL - Abnormal; Notable for the following:       Result Value   Sodium 125 (*)    Chloride 95 (*)    CO2 21 (*)    Glucose, Bld 143 (*)    BUN 49 (*)    Creatinine, Ser 1.34 (*)    Calcium 8.1 (*)    Total Protein 6.1 (*)    Albumin 1.5 (*)    AST 71 (*)    Alkaline Phosphatase 153 (*)    Total Bilirubin 14.8 (*)    GFR calc non Af Amer 58 (*)    All other components within normal limits  CBC WITH DIFFERENTIAL/PLATELET - Abnormal; Notable for the following:    WBC 18.4 (*)    RBC 3.24 (*)    Hemoglobin 11.2 (*)    HCT 31.8 (*)    MCH 34.6 (*)    RDW 18.6 (*)    Platelets 135 (*)    Neutro Abs 15.6 (*)    Monocytes Absolute 1.7 (*)    All other components within normal limits  AMMONIA - Abnormal; Notable for the following:    Ammonia 136 (*)    All other  components within normal limits  PROTIME-INR - Abnormal; Notable for the following:    Prothrombin Time 37.3 (*)    All other components within normal limits  CBG MONITORING, ED - Abnormal; Notable for the following:    Glucose-Capillary 132 (*)    All other components within normal limits  I-STAT CG4 LACTIC ACID, ED - Abnormal; Notable for the following:    Lactic Acid, Venous 3.78 (*)    All other components within normal limits  CULTURE, BLOOD (ROUTINE X 2)  CULTURE, BLOOD (ROUTINE X 2)  URINALYSIS, ROUTINE W REFLEX MICROSCOPIC  BRAIN NATRIURETIC PEPTIDE    EKG  EKG Interpretation None       Radiology Dg Chest Portable 1 View  Result Date: 06/10/2016 CLINICAL DATA:  Weakness.  Sepsis. EXAM: PORTABLE CHEST 1 VIEW COMPARISON:  05/30/2016. FINDINGS: Cardiomegaly with pulmonary vascular prominence and bilateral interstitial prominence with prominent left pleural effusion. Congestive heart failure could present this fashion. Bilateral pneumonitis cannot be excluded. It should be noted at the left-sided pleural effusion is increased significantly from prior exam. No pneumothorax. IMPRESSION: 1. Cardiomegaly with pulmonary vascular prominence and bilateral interstitial prominence with prominent left-sided pleural effusion. Congestive heart failure could present this fashion. Bilateral pneumonitis cannot be excluded. 2. Should be noted that the left-sided pleural effusion is progressed significantly from prior exam. Electronically Signed   By: Maisie Fus  Register   On: 05/18/2016 10:52    Procedures Procedures (including critical care time)  Medications Ordered in ED Medications  vancomycin (VANCOCIN) IVPB 1000 mg/200 mL premix (1,000 mg Intravenous New Bag/Given 05/27/2016 1035)  sodium chloride 0.9 % bolus 1,000 mL (1,000 mLs Intravenous New Bag/Given 05/24/2016 1030)  piperacillin-tazobactam (ZOSYN) IVPB 3.375 g (0 g Intravenous Stopped 06/07/2016 1105)  sodium chloride 0.9 % bolus 2,000 mL  (2,000 mLs Intravenous New Bag/Given 05/20/2016 1112)     Initial Impression / Assessment and Plan / ED Course  I have reviewed the triage vital signs and the nursing notes.  Pertinent labs & imaging results that were available during my care of the patient were reviewed by me and considered in my medical decision making (see chart for details).     CRITICAL CARE Performed by: Linford Quintela L Total critical care time: 40 minutes Critical care time was exclusive of separately billable procedures and treating other patients. Critical care was necessary to treat or prevent imminent or life-threatening deterioration. Critical care was time spent personally by me on the following activities: development of treatment plan with patient and/or surrogate as well as nursing, discussions with consultants, evaluation of patient's response to treatment, examination of patient, obtaining history from patient or surrogate, ordering and performing treatments and interventions, ordering and review of laboratory studies, ordering and review of radiographic studies, pulse oximetry and re-evaluation of patient's condition.  Patient will be admitted for sepsis liver failure. Final Clinical Impressions(s) / ED Diagnoses   Final diagnoses:  None    New Prescriptions New Prescriptions   No medications on file  CRITICAL CARE   EKG Interpretation None      The chart was scribed for me under my direct supervision.  I personally performed the history, physical, and medical decision making and all procedures in the evaluation of this patient.Jesus Berkshire, MD 05/14/2016 1139

## 2016-06-03 NOTE — ED Triage Notes (Signed)
EMS called out for confusion this morning.   Pt talking out of his head.  Pt jaundiced.  EMS ran pt last week for a fall.

## 2016-06-03 NOTE — Progress Notes (Signed)
Pharmacy Antibiotic Note  Jesus [name] is [name] 56 y.o. male admitted on 05/27/2016 with sepsis / possible pneumonia.  Pharmacy has been consulted for VANCOMYCIN, Jesus [name] dosing.  Plan:  Vancomycin 2000mg  x 1 then 1000mg  IV q8h Check trough at steady state Levaquin 750mg  IV q24hrs [name] 2gm x 1 then 1gm IV q8h Monitor labs, renal fxn, progress and c/s [name] when improved / appropriate.    Height: 6\' 2"  (188 cm) Weight: 292 lb 15.9 oz (132.9 kg) IBW/kg (Calculated) : 82.2  Temp (24hrs), Avg:96.1 F (35.6 C), Min:93.9 F (34.4 C), Max:96.9 F (36.1 C)   Recent Labs Lab 05/30/16 2016 06/04/2016 1030 06/10/2016 1031 06/12/2016 1308  WBC 11.2*  --  18.4*  --   CREATININE 0.46*  --  1.34*  --   LATICACIDVEN  --  3.78*  --  3.4*    Estimated Creatinine Clearance: 90.3 mL/min ([name]) (by C-G formula based on SCr of 1.34 mg/dL (H)).    Allergies  Allergen Reactions  . Morphine And Related Nausea And Vomiting  . Penicillins Nausea And Vomiting and Other (See Comments)    Has patient had [name] PCN reaction causing immediate rash, facial/tongue/throat swelling, SOB or lightheadedness with hypotension: No Has patient had [name] PCN reaction causing severe rash involving mucus membranes or skin necrosis: No Has patient had [name] PCN reaction that required hospitalization: No Has patient had [name] PCN reaction occurring within the last 10 years: No If all of the above answers are "NO", then may proceed with Cephalosporin use.   Antimicrobials this admission: Vancomycin 5/21 >>  Levaquin 5/21 >>  [name] 5/21 >>  Dose adjustments this admission:  Microbiology results:  BCx: pending  UCx: pending   Sputum:    MRSA PCR:   Thank you for allowing pharmacy to be [name] part of this patient's care.  Jesus [name], Jesus [name] 05/31/2016 2:01 PM

## 2016-06-03 NOTE — ED Notes (Signed)
Report given to Crystal, RN at this time.

## 2016-06-03 NOTE — Progress Notes (Signed)
1433 notified [facility] regarding order for PICC line placement.

## 2016-06-03 NOTE — Progress Notes (Signed)
Patient has had small urine amount on bed pad but d/t scrotal edema is unable to void efficiently. MD notified requesting indwelling catheter order.

## 2016-06-04 ENCOUNTER — Other Ambulatory Visit (HOSPITAL_COMMUNITY): Payer: Self-pay

## 2016-06-04 ENCOUNTER — Inpatient Hospital Stay (HOSPITAL_COMMUNITY): Payer: Self-pay

## 2016-06-04 DIAGNOSIS — A0472 Enterocolitis due to Clostridium difficile, not specified as recurrent: Secondary | ICD-10-CM

## 2016-06-04 LAB — PROTIME-INR
INR: 4.22 — AB
PROTHROMBIN TIME: 41.6 s — AB (ref 11.4–15.2)

## 2016-06-04 LAB — COMPREHENSIVE METABOLIC PANEL
ALK PHOS: 134 U/L — AB (ref 38–126)
ALT: 39 U/L (ref 17–63)
ANION GAP: 12 (ref 5–15)
AST: 74 U/L — ABNORMAL HIGH (ref 15–41)
Albumin: 1.9 g/dL — ABNORMAL LOW (ref 3.5–5.0)
BUN: 55 mg/dL — ABNORMAL HIGH (ref 6–20)
CALCIUM: 8 mg/dL — AB (ref 8.9–10.3)
CO2: 18 mmol/L — AB (ref 22–32)
Chloride: 98 mmol/L — ABNORMAL LOW (ref 101–111)
Creatinine, Ser: 1.87 mg/dL — ABNORMAL HIGH (ref 0.61–1.24)
GFR, EST AFRICAN AMERICAN: 45 mL/min — AB (ref >60)
GFR, EST NON AFRICAN AMERICAN: 39 mL/min — AB (ref >60)
Glucose, Bld: 143 mg/dL — ABNORMAL HIGH (ref 65–99)
Potassium: 3 mmol/L — ABNORMAL LOW (ref 3.5–5.1)
SODIUM: 128 mmol/L — AB (ref 135–145)
TOTAL PROTEIN: 5.7 g/dL — AB (ref 6.5–8.1)
Total Bilirubin: 15.3 mg/dL — ABNORMAL HIGH (ref 0.3–1.2)

## 2016-06-04 LAB — GLUCOSE, CAPILLARY
GLUCOSE-CAPILLARY: 159 mg/dL — AB (ref 65–99)
Glucose-Capillary: 146 mg/dL — ABNORMAL HIGH (ref 65–99)
Glucose-Capillary: 176 mg/dL — ABNORMAL HIGH (ref 65–99)
Glucose-Capillary: 131 mg/dL — ABNORMAL HIGH (ref 65–99)
Glucose-Capillary: 117 mg/dL — ABNORMAL HIGH (ref 65–99)
Glucose-Capillary: 108 mg/dL — ABNORMAL HIGH (ref 65–99)
Glucose-Capillary: 110 mg/dL — ABNORMAL HIGH (ref 65–99)

## 2016-06-04 LAB — CBC
HCT: 27.7 % — ABNORMAL LOW (ref 39.0–52.0)
HEMOGLOBIN: 9.5 g/dL — AB (ref 13.0–17.0)
MCH: 34.3 pg — AB (ref 26.0–34.0)
MCHC: 34.3 g/dL (ref 30.0–36.0)
MCV: 100 fL (ref 78.0–100.0)
PLATELETS: 94 10*3/uL — AB (ref 150–400)
RBC: 2.77 MIL/uL — AB (ref 4.22–5.81)
RDW: 18.7 % — ABNORMAL HIGH (ref 11.5–15.5)
WBC: 18.9 10*3/uL — AB (ref 4.0–10.5)

## 2016-06-04 LAB — AMMONIA: AMMONIA: 46 umol/L — AB (ref 9–35)

## 2016-06-04 LAB — C DIFFICILE QUICK SCREEN W PCR REFLEX
C DIFFICILE (CDIFF) TOXIN: INVALID — AB
C Diff antigen: INVALID — AB

## 2016-06-04 LAB — CLOSTRIDIUM DIFFICILE BY PCR: CDIFFPCR: POSITIVE — AB

## 2016-06-04 MED ORDER — SODIUM CHLORIDE 0.9% FLUSH
10.0000 mL | INTRAVENOUS | Status: DC | PRN
Start: 2016-06-04 — End: 2016-06-06

## 2016-06-04 MED ORDER — VANCOMYCIN 50 MG/ML ORAL SOLUTION
500.0000 mg | Freq: Four times a day (QID) | ORAL | Status: DC
  Administered 2016-06-04 – 2016-06-05 (×4): 500 mg via ORAL
  Filled 2016-06-04 (×5): qty 10

## 2016-06-04 MED ORDER — VANCOMYCIN HCL 10 G IV SOLR: 1500.0000 mg | INTRAVENOUS | Status: DC

## 2016-06-04 MED ORDER — LORAZEPAM 2 MG/ML IJ SOLN
0.5000 mg | INTRAMUSCULAR | Status: DC | PRN
  Administered 2016-06-04 – 2016-06-05 (×3): 0.5 mg via INTRAVENOUS
  Filled 2016-06-04 (×3): qty 1

## 2016-06-04 MED ORDER — CHLORHEXIDINE GLUCONATE CLOTH 2 % EX PADS
6.0000 | MEDICATED_PAD | Freq: Every day | CUTANEOUS | Status: DC
  Administered 2016-06-04: 6 via TOPICAL

## 2016-06-04 MED ORDER — METRONIDAZOLE IN NACL 5-0.79 MG/ML-% IV SOLN
500.0000 mg | Freq: Three times a day (TID) | INTRAVENOUS | Status: DC
  Administered 2016-06-04 – 2016-06-05 (×4): 500 mg via INTRAVENOUS
  Filled 2016-06-04 (×4): qty 100

## 2016-06-04 MED ORDER — SODIUM CHLORIDE 0.9% FLUSH
10.0000 mL | Freq: Two times a day (BID) | INTRAVENOUS | Status: DC
  Administered 2016-06-04 (×2): 10 mL

## 2016-06-04 NOTE — Progress Notes (Signed)
Core temperature 94.5 F, Hyperthermia blanket applied, will continue to monitor.

## 2016-06-04 NOTE — Progress Notes (Signed)
Daily Progress Note   Patient Name: [name]       Date: 06/04/2016 DOB: [birth year]  Age: 56 y.o. MRN#: [id number] Attending Physician: Jesus Blinks, MD Primary Care Physician: [name] Admit Date: [name] 30, 2018  Reason for Consultation/Follow-up: Establishing goals of care and Psychosocial/spiritual support  Subjective: [name] is lying in bed with his son providing mouth care. He is unable to make eye contact, or follow directions. His wife [name], mother, [name], son's [name] and [name] are at bedside.   I discuss [name] current condition, and [name] becomes angry stating that she will not give him pain medicine or any medication to sedate him. She states that it's important to her that he be able to recognize her as long as possible. [name] goes further to state that his only pain is here pointing to her head. I share with [name] that my concern that [name] is in fact having physical pain.  [name] sounds angry, I make this statement to her and she states that she thought I was coming to encourage her to give him pain medication. She angrily states that she wants her sons to make decisions at this point, asking [place] to leave the room.  I meet with sons [name], and [name] in the family waiting area. We talk about [name] continued decline, his worsening labs, now CDiff. I share my concern over suffering.  I share my worry that [name] is unable to see [name] suffering, and that she [name] blame her son's if they decide to focus on comfort at this time. All sons agree, stating they worry about their mother blaming them the rest of her life. I encourage sons to consider choices, let nursing of their decision, Dr. Kerry [name] will be here to provide pain medication if they  so desire. I share with sons that time [name] be short for [name].  Conference with nursing regarding family meeting. Conference with Dr. Kerry [name] regarding family meeting.   Length of Stay: 1  Current Medications: Scheduled Meds:  . Chlorhexidine Gluconate Cloth  6 each Topical Daily  . insulin aspart  0-9 Units Subcutaneous Q4H  . sodium chloride flush  10-40 mL Intracatheter Q12H  . vancomycin  500 mg Oral Q6H    Continuous Infusions: . sodium chloride 100 mL/hr  at 06/04/16 1346  . albumin human    . metronidazole Stopped (06/04/16 1114)  . phytonadione (VITAMIN K) IV Stopped (06/04/16 1201)    PRN Meds: acetaminophen **OR** acetaminophen, ondansetron **OR** ondansetron (ZOFRAN) IV, sodium chloride flush  Physical Exam  Constitutional:  Calling out, help me, water  HENT:  Head: Normocephalic and atraumatic.  Cardiovascular: Normal rate and regular rhythm.   Pulmonary/Chest: Effort normal. No respiratory distress.  Abdominal: He exhibits distension.  Musculoskeletal: He exhibits edema.  Neurological:  Lethargic, does not follow commands  Skin: Skin is warm and dry.  Jaundiced  Nursing note and vitals reviewed.           Vital Signs: BP (!) 103/50   Pulse 95   Temp (!) 94.3 F (34.6 C) (Core (Comment))   Resp 19   Ht 6\' 2"  (1.88 m)   Wt 132.9 kg (292 lb 15.9 oz)   SpO2 93%   BMI 37.62 kg/m  SpO2: SpO2: 93 % O2 Device: O2 Device: Nasal Cannula O2 Flow Rate: O2 Flow Rate (L/min): 2 L/min  Intake/output summary:  Intake/Output Summary (Last 24 hours) at 06/04/16 1513 Last data filed at 06/04/16 1217  Gross per 24 hour  Intake             2810 ml  Output                0 ml  Net             2810 ml   LBM: Last BM Date: 06/04/16 Baseline Weight: Weight: 129.3 kg (285 lb) Most recent weight: Weight: 132.9 kg (292 lb 15.9 oz)       Palliative Assessment/Data:    Flowsheet Rows     Most Recent Value  Intake Tab  Referral Department  Hospitalist  Unit  at Time of Referral  ICU  Palliative Care Primary Diagnosis  Sepsis/Infectious Disease  Date Notified  06/04/2016  Palliative Care Type  New Palliative care  Reason for referral  Clarify Goals of Care  Date of Admission  05/19/2016  Date first seen by Palliative Care  06/04/2016  # of days Palliative referral response time  0 Day(s)  # of days IP prior to Palliative referral  0  Clinical Assessment  Palliative Performance Scale Score  20%  Pain Max last 24 hours  Not able to report  Pain Min Last 24 hours  Not able to report  Dyspnea Max Last 24 Hours  Not able to report  Dyspnea Min Last 24 hours  Not able to report  Psychosocial & Spiritual Assessment  Palliative Care Outcomes  Patient/Family meeting held?  Yes  Who was at the meeting?  Wife Jesus JacobsonHelen and sons [name] and  [name]  Palliative Care Outcomes  Provided advance care planning, Provided psychosocial or spiritual support, Clarified goals of care  Patient/Family wishes: Interventions discontinued/not started   Mechanical Ventilation      Patient Active Problem List   Diagnosis Date Noted  . Sepsis (HCC) 06/02/2016  . Coagulopathy (HCC) 06/08/2016  . Anasarca 06/09/2016  . Pleural effusion on left 05/17/2016  . Acute hepatic encephalopathy 05/14/2016  . Hypoalbuminemia 05/23/2016  . Hyponatremia 05/17/2016  . Palliative care encounter   . Goals of care, counseling/discussion   . Acute CHF (HCC) 02/20/2016  . Ascites   . Cellulitis of scrotum 02/19/2016  . Liver cirrhosis, alcoholic (HCC) 02/19/2016  . Thrombocytopenia (HCC) 02/19/2016  . Hyperbilirubinemia 02/19/2016  . Abdominal pain, RUQ (right upper quadrant)  05/10/2013  . Chest pain 05/10/2013  . Diabetes (HCC) 05/10/2013  . Obesity 05/10/2013  . Alcohol abuse 05/10/2013  . RUQ abdominal pain 05/10/2013    Palliative Care Assessment & Plan   Patient Profile: 56 y.o. male  with past medical history of Cellulitis, obesity, liver failure, 6 to 7 hospital admissions  in the last 3 months admitted on 2016/06/07 with sepsis, etiology unclear, acute hepatic encephalopathy.   Assessment: 56 y.o. male  with past medical history of Cellulitis, obesity, liver failure, 6 to 7 hospital admissions in the last 3 months admitted on 06-07-2016 with sepsis, etiology unclear, acute hepatic encephalopathy.   Recommendations/Plan:  Continue with current course of treatment for 24 to 48 hours more. Adult sons are deciding whether to  transition to comfort measures.  Goals of Care and Additional Recommendations:  Limitations on Scope of Treatment: No CPR no intubation  Code Status:    Code Status Orders        Start     Ordered   06/07/2016 1343  Do not attempt resuscitation (DNR)  Continuous    Question Answer Comment  In the event of cardiac or respiratory ARREST Do not call a "code blue"   In the event of cardiac or respiratory ARREST Do not perform Intubation, CPR, defibrillation or ACLS   In the event of cardiac or respiratory ARREST Use medication by any route, position, wound care, and other measures to relive pain and suffering. [name] use oxygen, suction and manual treatment of airway obstruction as needed for comfort.      25-[name]-2018 1342    Code Status History    Date Active Date Inactive Code Status Order ID Comments User Context   [name] 25, 2018  1:09 PM [name] 25, 2018  1:42 PM DNR [id number]  Jesus Blinks, MD ED   02/19/2016  3:54 PM 02/25/2016  7:46 PM Full Code [id number]  Catarina Hartshorn, MD Inpatient   05/10/2013  4:07 PM 05/11/2013  4:27 PM Full Code [id number]  Wilson Singer, MD ED       Prognosis:   Hours - Days would not be surprising based on worsening condition  Discharge Planning:  Anticipated Hospital Death  Care plan was discussed with nursing staff, case manager, social worker, and Dr. Kerry [name].   Thank you for allowing the Palliative Medicine Team to assist in the care of this patient.   Time In: 1520 Time Out: 1600 Total Time 40 minutes  Prolonged Time Billed  no       Greater than 50%  of this time was spent counseling and coordinating care related to the above assessment and plan.  Katheran Awe, NP  Please contact Palliative Medicine Team phone at [phone] for questions and concerns.

## 2016-06-04 NOTE — Progress Notes (Signed)
Pharmacy Antibiotic Note  [name] is a 56 y.o. male admitted on 06/04/2016 with sepsis / possible pneumonia.  Pharmacy has been consulted for VANCOMYCIN, Barbera SettersLEVAQUIN AND AZTREONAM dosing.  SCr elevated this AM.  Obesity/Normalized CrCl Vancomycin dosing protocol will be initiated with an estimated normalized CrCl = 45 ml/min.   Plan:  Decrease Vancomycin to 1500mg  IV every 24 hours. Check trough at steady state Levaquin 750mg  IV q24hrs Aztreonam 2gm x 1 then 1gm IV q8h Monitor labs, renal fxn, progress and c/s [name] when improved / appropriate.    Height: 6\' 2"  (188 cm) Weight: 292 lb 15.9 oz (132.9 kg) IBW/kg (Calculated) : 82.2  Temp (24hrs), Avg:96.1 F (35.6 C), Min:93.9 F (34.4 C), Max:97.1 F (36.2 C)   Recent Labs Lab 05/30/16 2016 06/09/2016 1030 05/21/2016 1031 06/04/2016 1308 06/11/2016 1714 06/04/16 0407  WBC 11.2*  --  18.4*  --   --  18.9*  CREATININE 0.46*  --  1.34*  --   --  1.87*  LATICACIDVEN  --  3.78*  --  3.4* 3.5*  --     Estimated Creatinine Clearance: 64.7 mL/min (A) (by C-G formula based on SCr of 1.87 mg/dL (H)).    Allergies  Allergen Reactions  . Morphine And Related Nausea And Vomiting  . Penicillins Nausea And Vomiting and Other (See Comments)    Has patient had a PCN reaction causing immediate rash, facial/tongue/throat swelling, SOB or lightheadedness with hypotension: No Has patient had a PCN reaction causing severe rash involving mucus membranes or skin necrosis: No Has patient had a PCN reaction that required hospitalization: No Has patient had a PCN reaction occurring within the last 10 years: No If all of the above answers are "NO", then may proceed with Cephalosporin use.   Antimicrobials this admission: Vancomycin 5/21 >>  Levaquin 5/21 >>  Aztreonam 5/21 >>   Dose adjustments this admission: Vanc adjusted 5/22 due to bump in SCr   Microbiology results: 5/21 BCx: pending 5/21 UCx: pending  5/21 C.Diff: (+) 5/21  MRSA PCR: (-)  Thank you for allowing pharmacy to be a part of this patient's care.  [name], [name] 06/04/2016 8:12 AM

## 2016-06-04 NOTE — Progress Notes (Signed)
PROGRESS NOTE    [name]  WUJ:[id number] DOB: 1960-10-19 DOA: 06-07-2016 PCP: [facility]    Brief Narrative:  56 year old male with a history of alcoholic cirrhosis who has been chronically ill and has had multiple hospitalization the past few months. He presents to the hospital with worsening mental status, found to be hypotensive, sepsis and now found to have C. difficile colitis. He's been started on treatment with IV fluids, antibiotics. He has multiorgan failure including worsening liver function, renal function. Palliative care following. Comfort measures and hospice would be most appropriate in this situation.   Assessment & Plan:   Active Problems:   Diabetes (HCC)   Liver cirrhosis, alcoholic (HCC)   Thrombocytopenia (HCC)   Hyperbilirubinemia   Sepsis (HCC)   Coagulopathy (HCC)   Anasarca   Pleural effusion on left   Acute hepatic encephalopathy   Hypoalbuminemia   Hyponatremia   Palliative care encounter   Goals of care, counseling/discussion   1. Sepsis. Likely related to C diff. He has been receiving IV fluids, but blood pressures remain low. Follow-up cultures. He remains hypothermic on warming blankets. Start treatment for C. Difficile. 2. Clostridium difficile colitis. Stool tested positive for C. difficile and is having frequent diarrhea. Discontinue IV antibiotics started for sepsis on admission and started on oral vancomycin with IV Flagyl 3. Acute hepatic encephalopathy. Likely related to noncompliance with lactulose as well as dehydration from sepsis. Ammonia level has trended down since admission. Since he is having frequent stools, will hold off on further lactulose. 4. Acute kidney injury. Related to sepsis and hypoperfusion with hypotension. Renal function has worsened since yesterday. Continue IV fluids. Monitor urine output 5. Alcoholic liver cirrhosis. Bilirubin is markedly elevated. Part of this may be reactive to underlying  sepsis. Continue to monitor. 6. Recurrent Left pleural effusion. This is a recurrent effusion. He does not appear to be markedly short of breath at this time. With elevated INR, thoracentesis would be high risk. Since he does not appear to be in any respiratory distress, will hold off any procedures at this time.. 7. Hyponatremia. Suspect this is related to cirrhosis. Improved with IV fluids. 8. Hypoalbuminemia. Related to cirrhosis. Likely causing anasarca and pleural effusion. Started on IV albumin infusions. 9. Coagulopathy related to cirrhosis. Despite vitamin K, INR continues to trend up. 10. Thrombocytopenia. Related to cirrhosis. Continue to monitor. 11. Diabetes. Hold oral agents. Started on sliding scale insulin. Blood sugars have been stable 12. Discussion. The patient is critically ill. His multiorgan failure with worsening liver function, renal function. He remains hypotensive. Prognosis appears to be very poor. Palliative care following and plans are for meeting later today with family.   DVT prophylaxis: scds Code Status: DNR Family Communication: no family present Disposition Plan: pending hospital course, may have in-hospital death   Consultants:   Palliative care  Procedures:     Antimicrobials:   Vancomycin IV 5/21>>5/22  levaquin 5/21>>5/22  Aztreonam 5/21>>5/22  Flagyl 5/22>>  Vancomycin po 5/22>>    Subjective: Confused, cannot provide history  Objective: Vitals:   2016-06-07 2000 Jun 07, 2016 2040 06/04/16 0300 06/04/16 0736  BP:      Pulse:  99  100  Resp:  20  (!) 26  Temp: (!) 96.9 F (36.1 C)  97.1 F (36.2 C) (!) 94.6 F (34.8 C)  TempSrc: Axillary  Axillary Core (Comment)  SpO2:  92% 92% 92%  Weight:      Height:  Intake/Output Summary (Last 24 hours) at 06/04/16 0848 Last data filed at 06/04/16 [id number]  Gross per 24 hour  Intake            13325 ml  Output                0 ml  Net            13325 ml   Filed Weights   05/19/2016  0959 05/28/2016 1343  Weight: 129.3 kg (285 lb) 132.9 kg (292 lb 15.9 oz)    Examination:  General exam: Appears calm and comfortable  Respiratory system: diminished breath sounds on left side. Respiratory effort normal. Cardiovascular system: S1 & S2 heard, RRR. No JVD, murmurs, rubs, gallops or clicks. 3+ pedal edema. Gastrointestinal system: Abdomen is distended, firm and nontender. No organomegaly or masses felt. Normal bowel sounds heard. Central nervous system:  No focal neurological deficits. Extremities: Symmetric  Skin: juandiced Psychiatry: confused     Data Reviewed: I have personally reviewed following labs and imaging studies  CBC:  Recent Labs Lab 05/30/16 2016 05/18/2016 1031 06/04/16 0407  WBC 11.2* 18.4* 18.9*  NEUTROABS 7.9* 15.6*  --   HGB 12.4* 11.2* 9.5*  HCT 35.5* 31.8* 27.7*  MCV 97.8 98.1 100.0  PLT 130* 135* 94*   Basic Metabolic Panel:  Recent Labs Lab 05/30/16 2016 05/17/2016 1031 06/04/16 0407  NA 125* 125* 128*  K 3.8 3.8 3.0*  CL 94* 95* 98*  CO2 25 21* 18*  GLUCOSE 133* 143* 143*  BUN 17 49* 55*  CREATININE 0.46* 1.34* 1.87*  CALCIUM 8.3* 8.1* 8.0*   GFR: Estimated Creatinine Clearance: 64.7 mL/min (A) (by C-G formula based on SCr of 1.87 mg/dL (H)). Liver Function Tests:  Recent Labs Lab 05/30/16 2016 05/30/2016 1031 06/04/16 0407  AST 82* 71* 74*  ALT 42 45 39  ALKPHOS 170* 153* 134*  BILITOT 12.2* 14.8* 15.3*  PROT 6.4* 6.1* 5.7*  ALBUMIN 1.6* 1.5* 1.9*   No results for input(s): LIPASE, AMYLASE in the last 168 hours.  Recent Labs Lab 05/27/2016 1031 06/04/16 0407  AMMONIA 136* 46*   Coagulation Profile:  Recent Labs Lab 05/30/16 2016 05/29/2016 1032 06/04/16 0407  INR 3.13 3.66 4.22*   Cardiac Enzymes: No results for input(s): CKTOTAL, CKMB, CKMBINDEX, TROPONINI in the last 168 hours. BNP (last 3 results) No results for input(s): PROBNP in the last 8760 hours. HbA1C: No results for input(s): HGBA1C in the  last 72 hours. CBG:  Recent Labs Lab 05/16/2016 1604 06/02/2016 1951 06/04/16 0021 06/04/16 0447 06/04/16 0732  GLUCAP 146* 129* 159* 146* 176*   Lipid Profile: No results for input(s): CHOL, HDL, LDLCALC, TRIG, CHOLHDL, LDLDIRECT in the last 72 hours. Thyroid Function Tests: No results for input(s): TSH, T4TOTAL, FREET4, T3FREE, THYROIDAB in the last 72 hours. Anemia Panel: No results for input(s): VITAMINB12, FOLATE, FERRITIN, TIBC, IRON, RETICCTPCT in the last 72 hours. Sepsis Labs:  Recent Labs Lab 05/22/2016 1030 06/11/2016 1308 05/16/2016 1714  PROCALCITON  --  0.71  --   LATICACIDVEN 3.78* 3.4* 3.5*    Recent Results (from the past 240 hour(s))  Blood Culture (routine x 2)     Status: None (Preliminary result)   Collection Time: 06/02/2016 10:31 AM  Result Value Ref Range Status   Specimen Description BLOOD RIGHT WRIST  Final   Special Requests   Final    BOTTLES DRAWN AEROBIC AND ANAEROBIC Blood Culture adequate volume   Culture NO GROWTH <12 HOURS  Final   Report Status PENDING  Incomplete  Blood Culture (routine x 2)     Status: None (Preliminary result)   Collection Time: 06/06/16 10:32 AM  Result Value Ref Range Status   Specimen Description BLOOD LEFT HAND  Final   Special Requests   Final    BOTTLES DRAWN AEROBIC AND ANAEROBIC Blood Culture results may not be optimal due to an inadequate volume of blood received in culture bottles   Culture NO GROWTH <12 HOURS  Final   Report Status PENDING  Incomplete  C difficile quick scan w PCR reflex     Status: Abnormal   Collection Time: Jun 06, 2016  1:43 PM  Result Value Ref Range Status   C Diff antigen (A) NEGATIVE Final    INVALID, POSSIBLE SAMPLE INTEGRITY COMPROMISED, TEST WAS REPEATED    Comment: MCGIBBONY,C RN @1653  BY MATTHEWS,B 06/06/16   C Diff toxin (A) NEGATIVE Final    INVALID, POSSIBLE SAMPLE INTEGRITY COMPROMISED, TEST WAS REPEATED  Clostridium Difficile by PCR     Status: Abnormal   Collection Time:  06-06-16  1:43 PM  Result Value Ref Range Status   Toxigenic C Difficile by pcr POSITIVE (A) NEGATIVE Final    Comment: Positive for toxigenic C. difficile with little to no toxin production. Only treat if clinical presentation suggests symptomatic illness. RESULT CALLED TO, READ BACK BY AND VERIFIED WITH: I.MASLENJAK,RN AT 0157 06/04/16,L.PITT   MRSA PCR Screening     Status: None   Collection Time: Jun 06, 2016  1:44 PM  Result Value Ref Range Status   MRSA by PCR NEGATIVE NEGATIVE Final    Comment:        The GeneXpert MRSA Assay (FDA approved for NASAL specimens only), is one component of a comprehensive MRSA colonization surveillance program. It is not intended to diagnose MRSA infection nor to guide or monitor treatment for MRSA infections.          Radiology Studies: Dg Chest Portable 1 View  Result Date: 2016/06/06 CLINICAL DATA:  Weakness.  Sepsis. EXAM: PORTABLE CHEST 1 VIEW COMPARISON:  05/30/2016. FINDINGS: Cardiomegaly with pulmonary vascular prominence and bilateral interstitial prominence with prominent left pleural effusion. Congestive heart failure could present this fashion. Bilateral pneumonitis cannot be excluded. It should be noted at the left-sided pleural effusion is increased significantly from prior exam. No pneumothorax. IMPRESSION: 1. Cardiomegaly with pulmonary vascular prominence and bilateral interstitial prominence with prominent left-sided pleural effusion. Congestive heart failure could present this fashion. Bilateral pneumonitis cannot be excluded. 2. Should be noted that the left-sided pleural effusion is progressed significantly from prior exam. Electronically Signed   By: Maisie Fus  Register   On: Jun 06, 2016 10:52        Scheduled Meds: . insulin aspart  0-9 Units Subcutaneous Q4H  . vancomycin  500 mg Oral Q6H   Continuous Infusions: . sodium chloride 100 mL/hr at 06/04/16 0024  . albumin human    . metronidazole    . phytonadione (VITAMIN  K) IV Stopped (06-06-16 1520)     LOS: 1 day    Critical care Time spent:    Kiefer Opheim, MD [facility] Pager [phone]  If 7PM-7AM, please contact night-coverage [email or web address] Password [facility] 06/04/2016, 8:48 AM

## 2016-06-05 DIAGNOSIS — D689 Coagulation defect, unspecified: Secondary | ICD-10-CM

## 2016-06-05 DIAGNOSIS — K7200 Acute and subacute hepatic failure without coma: Secondary | ICD-10-CM

## 2016-06-05 DIAGNOSIS — A419 Sepsis, unspecified organism: Secondary | ICD-10-CM

## 2016-06-05 LAB — COMPREHENSIVE METABOLIC PANEL
ALBUMIN: 2.2 g/dL — AB (ref 3.5–5.0)
ALT: 48 U/L (ref 17–63)
ANION GAP: 13 (ref 5–15)
AST: 98 U/L — ABNORMAL HIGH (ref 15–41)
Alkaline Phosphatase: 134 U/L — ABNORMAL HIGH (ref 38–126)
BUN: 63 mg/dL — ABNORMAL HIGH (ref 6–20)
CHLORIDE: 101 mmol/L (ref 101–111)
CO2: 18 mmol/L — ABNORMAL LOW (ref 22–32)
Calcium: 8.2 mg/dL — ABNORMAL LOW (ref 8.9–10.3)
Creatinine, Ser: 2.76 mg/dL — ABNORMAL HIGH (ref 0.61–1.24)
GFR calc non Af Amer: 24 mL/min — ABNORMAL LOW (ref >60)
GFR, EST AFRICAN AMERICAN: 28 mL/min — AB (ref >60)
GLUCOSE: 108 mg/dL — AB (ref 65–99)
POTASSIUM: 3.3 mmol/L — AB (ref 3.5–5.1)
SODIUM: 132 mmol/L — AB (ref 135–145)
Total Bilirubin: 15.2 mg/dL — ABNORMAL HIGH (ref 0.3–1.2)
Total Protein: 5.8 g/dL — ABNORMAL LOW (ref 6.5–8.1)

## 2016-06-05 LAB — URINE CULTURE: Culture: NO GROWTH

## 2016-06-05 LAB — CBC
HCT: 27.8 % — ABNORMAL LOW (ref 39.0–52.0)
Hemoglobin: 9.5 g/dL — ABNORMAL LOW (ref 13.0–17.0)
MCH: 34.7 pg — AB (ref 26.0–34.0)
MCHC: 34.2 g/dL (ref 30.0–36.0)
MCV: 101.5 fL — ABNORMAL HIGH (ref 78.0–100.0)
PLATELETS: 81 10*3/uL — AB (ref 150–400)
RBC: 2.74 MIL/uL — ABNORMAL LOW (ref 4.22–5.81)
RDW: 19.7 % — ABNORMAL HIGH (ref 11.5–15.5)
WBC: 15 10*3/uL — ABNORMAL HIGH (ref 4.0–10.5)

## 2016-06-05 LAB — GLUCOSE, CAPILLARY
GLUCOSE-CAPILLARY: 117 mg/dL — AB (ref 65–99)
Glucose-Capillary: 110 mg/dL — ABNORMAL HIGH (ref 65–99)

## 2016-06-05 LAB — PROTIME-INR
INR: 4.33 — AB
PROTHROMBIN TIME: 42.3 s — AB (ref 11.4–15.2)

## 2016-06-05 MED ORDER — LORAZEPAM 2 MG/ML IJ SOLN: 1.0000 mg | INTRAMUSCULAR | Status: DC | PRN

## 2016-06-05 MED ORDER — VANCOMYCIN 50 MG/ML ORAL SOLUTION
250.0000 mg | Freq: Four times a day (QID) | ORAL | Status: DC
  Filled 2016-06-05 (×9): qty 5

## 2016-06-08 LAB — CULTURE, BLOOD (ROUTINE X 2)
Culture: NO GROWTH
Culture: NO GROWTH
SPECIAL REQUESTS: ADEQUATE

## 2016-06-14 NOTE — Progress Notes (Signed)
[facility] Tsosie BillingHelen Borton notified and will make arrangements soon. Bed placement ok with moving body to morgue.  AC house supervisor to transport to morgue with death certificate.

## 2016-06-14 NOTE — Progress Notes (Signed)
Patient Demographics:    Jesus AugustaDavid [name], is a 56 y.o. male, DOB - 12/11/1960, XLK:[id number]  Admit date - 10/03/16   Admitting Physician Erick BlinksJehanzeb Memon, MD  Outpatient Primary MD for the patient is Pll[facility], [facility]  LOS - 2   Chief Complaint  Patient presents with  . Altered Mental Status        Subjective:    Jesus AugustaDavid [name] today has no fevers, no emesis,  Patient's wife and sons at bedside, patient continues to decline, no significant urine output in the last 4 hours, INR is trending up creatinine elevated, persistent hypotension despite IV fluids, persistent hypothermia  Assessment  & Plan :    Active Problems:   Diabetes (HCC)   Liver cirrhosis, alcoholic (HCC)   Thrombocytopenia (HCC)   Hyperbilirubinemia   Acute onset sepsis (HCC)   Coagulopathy (HCC)   Anasarca   Pleural effusion on left   Acute hepatic encephalopathy   Hypoalbuminemia   Hyponatremia   Palliative care encounter   Goals of care, counseling/discussion    1)Comfort Care- Plan of care d/w Pt's wife Myriam Jacobson(Helen), Sons Gaylyn Rong( Kris, [place], and [place] Park[place]achary) and [employer], At this time family would like to stop active treatment and make patient comfort measures only. Overall prognosis is grave. Hypothermia persist with temperature of 33C, persistent hypotension with systolic blood pressure in the 70s despite IV fluids, elevated INR and creatinine noted. Altered mentation/significant lethargy persist. Lactic acid was elevated. D/w Ms Aurelio Jewosha Dove from palliative care services  2)C Diff Colitis with Sepsis and multi-organ failure- comfort measures as above in #1  3)Alcoholic cirrhosis now with coagulopathy and acute hepatic encephalopathy in the setting of sepsis  4)Renal Failure- only 10 ml of urine output in last 4 hrs despite IV fluids  Code Status : DO NOT RESUSCITATE  Disposition Plan  : Actively dying, comfort measures  initiated on 05/21/2016  Consults  :  Palliative care and [place]/spiritual consult  DVT Prophylaxis  : Coagulopathy  Lab Results  Component Value Date   PLT 81 (L) 05/23/2016    Inpatient Medications  Scheduled Meds: . Chlorhexidine Gluconate Cloth  6 each Topical Daily  . insulin aspart  0-9 Units Subcutaneous Q4H  . sodium chloride flush  10-40 mL Intracatheter Q12H   Continuous Infusions: . sodium chloride 100 mL/hr at 06/02/2016 0044  . phytonadione (VITAMIN K) IV Stopped (06/04/16 1201)   PRN Meds:.acetaminophen **OR** acetaminophen, LORazepam, ondansetron **OR** ondansetron (ZOFRAN) IV, sodium chloride flush    Anti-infectives    Start     Dose/Rate Route Frequency Ordered Stop   06/11/2016 1200  vancomycin (VANCOCIN) 50 mg/mL oral solution 250 mg  Status:  Discontinued     250 mg Oral Every 6 hours 05/14/2016 0857 06/07/2016 1211   05/23/2016 0600  vancomycin (VANCOCIN) 1,500 mg in sodium chloride 0.9 % 500 mL IVPB  Status:  Discontinued     1,500 mg 250 mL/hr over 120 Minutes Intravenous Every 24 hours 06/04/16 0817 06/04/16 0846   06/04/16 2200  vancomycin (VANCOCIN) 1,500 mg in sodium chloride 0.9 % 500 mL IVPB  Status:  Discontinued     1,500 mg 250 mL/hr over 120 Minutes Intravenous Every 24 hours 06/04/16 0814 06/04/16 0817   06/04/16 1500  levofloxacin (LEVAQUIN) IVPB 750 mg  Status:  Discontinued     750 mg 100 mL/hr over 90 Minutes Intravenous Every 24 hours 06/11/2016 1350 06/04/16 0846   06/04/16 1200  vancomycin (VANCOCIN) 50 mg/mL oral solution 500 mg  Status:  Discontinued     500 mg Oral Every 6 hours 06/04/16 0847 06/04/2016 0857   06/04/16 0900  metroNIDAZOLE (FLAGYL) IVPB 500 mg  Status:  Discontinued     500 mg 100 mL/hr over 60 Minutes Intravenous Every 8 hours 06/04/16 0847 05/15/2016 1211   06/04/16 0100  aztreonam (AZACTAM) 1 g in dextrose 5 % 50 mL IVPB  Status:  Discontinued     1 g 100 mL/hr over 30 Minutes Intravenous Every 8 hours 06/06/2016 1355  06/04/16 0846   06/11/2016 2200  vancomycin (VANCOCIN) IVPB 1000 mg/200 mL premix  Status:  Discontinued     1,000 mg 200 mL/hr over 60 Minutes Intravenous Every 8 hours 05/16/2016 1349 06/04/16 0814   Jun 05, 2016 1445  vancomycin (VANCOCIN) 2,000 mg in sodium chloride 0.9 % 500 mL IVPB     2,000 mg 250 mL/hr over 120 Minutes Intravenous  Once 05/17/2016 1344 06/10/2016 1816   Jun 05, 2016 1345  levofloxacin (LEVAQUIN) IVPB 750 mg     750 mg 100 mL/hr over 90 Minutes Intravenous  Once Jun 05, 2016 1342 Jun 05, 2016 1529   Jun 05, 2016 1345  aztreonam (AZACTAM) 2 g in dextrose 5 % 50 mL IVPB     2 g 100 mL/hr over 30 Minutes Intravenous  Once 05/31/2016 1342 Jun 05, 2016 1646   05-Jun-2016 1345  vancomycin (VANCOCIN) IVPB 1000 mg/200 mL premix  Status:  Discontinued     1,000 mg 200 mL/hr over 60 Minutes Intravenous  Once 06/02/2016 1342 05/18/2016 1344   06/10/2016 1030  vancomycin (VANCOCIN) IVPB 1000 mg/200 mL premix     1,000 mg 200 mL/hr over 60 Minutes Intravenous  Once 05/28/2016 1027 Jun 05, 2016 1135   Jun 05, 2016 1030  piperacillin-tazobactam (ZOSYN) IVPB 3.375 g     3.375 g 100 mL/hr over 30 Minutes Intravenous  Once 05/22/2016 1029 Jun 05, 2016 1105        Objective:   Vitals:   05/26/2016 0600 06/13/2016 0700 05/15/2016 0800 05/16/2016 0900  BP: (!) 79/45 (!) 73/43 (!) 78/46 (!) 76/41  Pulse: 94 90 91   Resp: (!) 24 19 (!) 24 (!) 22  Temp:      TempSrc:      SpO2: 93% 93% 99%   Weight:      Height:        Wt Readings from Last 3 Encounters:  05/19/2016 132.9 kg (292 lb 15.9 oz)  05/30/16 129.3 kg (285 lb)  02/25/16 117.6 kg (259 lb 4.2 oz)     Intake/Output Summary (Last 24 hours) at 06/08/2016 1212 Last data filed at 05/28/2016 0600  Gross per 24 hour  Intake             2000 ml  Output              100 ml  Net             1900 ml     Physical Exam  Gen:- Very lethargic, not responsive HEENT:- [place].AT,  sclera icterus Neck-Supple Neck Lungs-  diminished in bases  CV- S1, S2 normal Abd-  +ve B.Sounds, Abd Soft,    Extremity/Skin:- Anasarca lower extremities weeping clear fluid Neuropsych- encephalopathic, nonverbal    Data Review:   Micro Results Recent Results (from the past 240 hour(s))  Blood Culture (routine x 2)     Status: None (Preliminary result)   Collection Time: 2016/06/21 10:31 AM  Result Value Ref Range Status   Specimen Description BLOOD RIGHT WRIST  Final   Special Requests   Final    BOTTLES DRAWN AEROBIC AND ANAEROBIC Blood Culture adequate volume   Culture NO GROWTH 2 DAYS  Final   Report Status PENDING  Incomplete  Blood Culture (routine x 2)     Status: None (Preliminary result)   Collection Time: 06-21-16 10:32 AM  Result Value Ref Range Status   Specimen Description BLOOD LEFT HAND  Final   Special Requests   Final    BOTTLES DRAWN AEROBIC AND ANAEROBIC Blood Culture results may not be optimal due to an inadequate volume of blood received in culture bottles   Culture NO GROWTH 2 DAYS  Final   Report Status PENDING  Incomplete  C difficile quick scan w PCR reflex     Status: Abnormal   Collection Time: 06-21-16  1:43 PM  Result Value Ref Range Status   C Diff antigen (A) NEGATIVE Final    INVALID, POSSIBLE SAMPLE INTEGRITY COMPROMISED, TEST WAS REPEATED    Comment: MCGIBBONY,C RN @1653  BY MATTHEWS,B 06-21-16   C Diff toxin (A) NEGATIVE Final    INVALID, POSSIBLE SAMPLE INTEGRITY COMPROMISED, TEST WAS REPEATED  Urine culture     Status: None   Collection Time: 06-21-2016  1:43 PM  Result Value Ref Range Status   Specimen Description URINE, CLEAN CATCH  Final   Special Requests NONE  Final   Culture   Final    NO GROWTH Performed at [facility], [address], [place], [place] [address]    Report Status 06/12/2016 FINAL  Final  Clostridium Difficile by PCR     Status: Abnormal   Collection Time: 06-21-16  1:43 PM  Result Value Ref Range Status   Toxigenic C Difficile by pcr POSITIVE (A) NEGATIVE Final    Comment: Positive for toxigenic C. difficile with  little to no toxin production. Only treat if clinical presentation suggests symptomatic illness. RESULT CALLED TO, READ BACK BY AND VERIFIED WITH: I.MASLENJAK,RN AT 0157 06/04/16,L.PITT   MRSA PCR Screening     Status: None   Collection Time: 2016-06-21  1:44 PM  Result Value Ref Range Status   MRSA by PCR NEGATIVE NEGATIVE Final    Comment:        The GeneXpert MRSA Assay (FDA approved for NASAL specimens only), is one component of a comprehensive MRSA colonization surveillance program. It is not intended to diagnose MRSA infection nor to guide or monitor treatment for MRSA infections.     Radiology Reports Dg Chest 2 View  Result Date: 05/30/2016 CLINICAL DATA:  Dyspnea. EXAM: CHEST  2 VIEW COMPARISON:  05/25/2016 and 05/05/2016 FINDINGS: Moderate left pleural effusion and left lower lobe compressive atelectasis show no significant interval change. No evidence of right-sided pleural effusion or pneumothorax. Heart size remains normal. IMPRESSION: No significant change in moderate left pleural effusion and left lower lobe atelectasis. Electronically Signed   By: Myles Rosenthal M.D.   On: 05/30/2016 19:57   Dg Chest Portable 1 View  Result Date: Jun 21, 2016 CLINICAL DATA:  Weakness.  Sepsis. EXAM: PORTABLE CHEST 1 VIEW COMPARISON:  05/30/2016. FINDINGS: Cardiomegaly with pulmonary vascular prominence and bilateral interstitial prominence with prominent left pleural effusion. Congestive heart failure could present this fashion. Bilateral pneumonitis cannot be excluded. It should be noted at the left-sided  pleural effusion is increased significantly from prior exam. No pneumothorax. IMPRESSION: 1. Cardiomegaly with pulmonary vascular prominence and bilateral interstitial prominence with prominent left-sided pleural effusion. Congestive heart failure could present this fashion. Bilateral pneumonitis cannot be excluded. 2. Should be noted that the left-sided pleural effusion is progressed  significantly from prior exam. Electronically Signed   By: Maisie Fus  Register   On: 06-07-16 10:52     CBC  Recent Labs Lab 05/30/16 2016 Jun 07, 2016 1031 06/04/16 0407 05/24/2016 0414  WBC 11.2* 18.4* 18.9* 15.0*  HGB 12.4* 11.2* 9.5* 9.5*  HCT 35.5* 31.8* 27.7* 27.8*  PLT 130* 135* 94* 81*  MCV 97.8 98.1 100.0 101.5*  MCH 34.2* 34.6* 34.3* 34.7*  MCHC 34.9 35.2 34.3 34.2  RDW 17.8* 18.6* 18.7* 19.7*  LYMPHSABS 1.2 1.0  --   --   MONOABS 1.8* 1.7*  --   --   EOSABS 0.3 0.1  --   --   BASOSABS 0.0 0.0  --   --     Chemistries   Recent Labs Lab 05/30/16 2016 06-07-16 1031 06/04/16 0407 05/26/2016 0414  NA 125* 125* 128* 132*  K 3.8 3.8 3.0* 3.3*  CL 94* 95* 98* 101  CO2 25 21* 18* 18*  GLUCOSE 133* 143* 143* 108*  BUN 17 49* 55* 63*  CREATININE 0.46* 1.34* 1.87* 2.76*  CALCIUM 8.3* 8.1* 8.0* 8.2*  AST 82* 71* 74* 98*  ALT 42 45 39 48  ALKPHOS 170* 153* 134* 134*  BILITOT 12.2* 14.8* 15.3* 15.2*   ------------------------------------------------------------------------------------------------------------------ No results for input(s): CHOL, HDL, LDLCALC, TRIG, CHOLHDL, LDLDIRECT in the last 72 hours.  Lab Results  Component Value Date   HGBA1C 5.0 02/19/2016   ------------------------------------------------------------------------------------------------------------------ No results for input(s): TSH, T4TOTAL, T3FREE, THYROIDAB in the last 72 hours.  Invalid input(s): FREET3 ------------------------------------------------------------------------------------------------------------------ No results for input(s): VITAMINB12, FOLATE, FERRITIN, TIBC, IRON, RETICCTPCT in the last 72 hours.  Coagulation profile  Recent Labs Lab 05/30/16 2016 07-Jun-2016 1032 06/04/16 0407 05/17/2016 0414  INR 3.13 3.66 4.22* 4.33*    No results for input(s): DDIMER in the last 72 hours.  Cardiac Enzymes No results for input(s): CKMB, TROPONINI, MYOGLOBIN in the last 168  hours.  Invalid input(s): CK ------------------------------------------------------------------------------------------------------------------    Component Value Date/Time   BNP 356.0 (H) 07-Jun-2016 1117     Akila Batta M.D on 05/31/2016 at 12:12 PM  Between 7am to 7pm - Pager - [phone]  After 7pm go to [email or web address] - password [email or web address]  [facility] -  Office  [phone]  Dragon dictation system was used to create this note, attempts have been made to correct errors, however presence of uncorrected errors is not a reflection quality of care provided

## 2016-06-14 NOTE — Progress Notes (Addendum)
In to see patient at this time. Patient noted to be asystole via monitor.  TOD 2002.  Two nurse auscultation verification [name](Tannon Peerson RN/ Chinita GreenlandJames Daniels RN)  with no noted heart tones upon assessment.  MD Nedra HaiLee notified at this time, MD Nedra HaiLee to sign certificate. AC Mike Gip(Anne Hunt) house supervisor called and notified at this time. Family still visiting with patient at this time and unsure of final destination of patient.  Wife Myriam JacobsonHelen provided contact information [phone]. [place] donor called at this time, referral number 05/18/2016-056.  Patient not suitable donor at this time.

## 2016-06-14 NOTE — Progress Notes (Signed)
Daily Progress Note   Patient Name: Gwenyth BenderDavid W Ricke       Date: 05/26/2016 DOB: [birth year]  Age: 56 y.o. MRN#: [id number] Attending Physician: Shon HaleEmokpae, Courage, MD Primary Care Physician: [facility], [facility] Admit Date: Sep 30, 2016  Reason for Consultation/Follow-up: Establishing goals of care, Psychosocial/spiritual support and Terminal Care  Subjective: Mr. Noelle PennerGibbs is lying in bed, surrounded by his family. He will moan occasionally. He does not open his eyes or respond to voice or touch. We talk about changes in vital signs, we talk about comfort measures. I encourage family to reach out to nursing staff at anytime they feel Mr. Noelle PennerGibbs is uncomfortable. Adult son Gerri SporeWesley is at bedside holding his father's hand. Wife Myriam JacobsonHelen only briefly makes eye contact, but denies questions or concerns. Conference with nursing staff regarding plan of care. Conference with Dr. Bea LauraE regarding plan of care.  Length of Stay: 2  Current Medications: Scheduled Meds:  . Chlorhexidine Gluconate Cloth  6 each Topical Daily  . insulin aspart  0-9 Units Subcutaneous Q4H  . sodium chloride flush  10-40 mL Intracatheter Q12H  . vancomycin  250 mg Oral Q6H    Continuous Infusions: . sodium chloride 100 mL/hr at 05/17/2016 0044  . metronidazole Stopped (05/22/2016 1102)  . phytonadione (VITAMIN K) IV Stopped (06/04/16 1201)    PRN Meds: acetaminophen **OR** acetaminophen, LORazepam, ondansetron **OR** ondansetron (ZOFRAN) IV, sodium chloride flush  Physical Exam  Constitutional:  Lying in bed  HENT:  Head: Normocephalic and atraumatic.  Cardiovascular: Normal rate and regular rhythm.   Pulmonary/Chest: Effort normal. No respiratory distress.  Abdominal: He exhibits distension.  Musculoskeletal: He exhibits  edema.  Neurological:  Does not open eyes to touch or voice  Skin: Skin is warm and dry.  Jaundiced  Nursing note and vitals reviewed.           Vital Signs: BP (!) 76/41   Pulse 91   Temp 97.1 F (36.2 C) (Core (Comment))   Resp (!) 22   Ht 6\' 2"  (1.88 m)   Wt 132.9 kg (292 lb 15.9 oz)   SpO2 99%   BMI 37.62 kg/m  SpO2: SpO2: 99 % O2 Device: O2 Device: Nasal Cannula O2 Flow Rate: O2 Flow Rate (L/min): 2 L/min  Intake/output summary:  Intake/Output Summary (Last 24 hours) at 05/26/2016 1203  Last data filed at 06/01/2016 0600  Gross per 24 hour  Intake             2000 ml  Output              100 ml  Net             1900 ml   LBM: Last BM Date: 06/04/16 Baseline Weight: Weight: 129.3 kg (285 lb) Most recent weight: Weight: 132.9 kg (292 lb 15.9 oz)       Palliative Assessment/Data:    Flowsheet Rows     Most Recent Value  Intake Tab  Referral Department  Hospitalist  Unit at Time of Referral  ICU  Palliative Care Primary Diagnosis  Sepsis/Infectious Disease  Date Notified  06-04-16  Palliative Care Type  New Palliative care  Reason for referral  Clarify Goals of Care  Date of Admission  04-Jun-2016  Date first seen by Palliative Care  2016/06/04  # of days Palliative referral response time  0 Day(s)  # of days IP prior to Palliative referral  0  Clinical Assessment  Palliative Performance Scale Score  20%  Pain Max last 24 hours  Not able to report  Pain Min Last 24 hours  Not able to report  Dyspnea Max Last 24 Hours  Not able to report  Dyspnea Min Last 24 hours  Not able to report  Psychosocial & Spiritual Assessment  Palliative Care Outcomes  Patient/Family meeting held?  Yes  Who was at the meeting?  Wife Myriam Jacobson and sons Gaylyn Rong and  [place]  Palliative Care Outcomes  Provided advance care planning, Provided psychosocial or spiritual support, Clarified goals of care  Patient/Family wishes: Interventions discontinued/not started   Mechanical Ventilation       Patient Active Problem List   Diagnosis Date Noted  . Acute onset sepsis (HCC) 06/04/16  . Coagulopathy (HCC) 04-Jun-2016  . Anasarca 06-04-16  . Pleural effusion on left 06/04/16  . Acute hepatic encephalopathy 06/04/16  . Hypoalbuminemia 06/04/16  . Hyponatremia 06/04/16  . Palliative care encounter   . Goals of care, counseling/discussion   . Acute CHF (HCC) 02/20/2016  . Ascites   . Cellulitis of scrotum 02/19/2016  . Liver cirrhosis, alcoholic (HCC) 02/19/2016  . Thrombocytopenia (HCC) 02/19/2016  . Hyperbilirubinemia 02/19/2016  . Abdominal pain, RUQ (right upper quadrant) 05/10/2013  . Chest pain 05/10/2013  . Diabetes (HCC) 05/10/2013  . Obesity 05/10/2013  . Alcohol abuse 05/10/2013  . RUQ abdominal pain 05/10/2013    Palliative Care Assessment & Plan   Patient Profile: 56 y.o.malewith past medical history of Cellulitis, obesity, liver failure, 6 to 7 hospital admissions in the last on 05/22/18with sepsis, etiology unclear, acute hepatic encephalopathy.   Assessment: End of life; DES LDC, 6 to 7 hospitalizations in the last 3 months, sepsis, family has decided to focus on comfort and dignity, no more aggressive treatments.  Recommendations/Plan:  full comfort care  Goals of Care and Additional Recommendations:  Limitations on Scope of Treatment: Full Comfort Care  Code Status:    Code Status Orders        Start     Ordered   06-04-2016 1343  Do not attempt resuscitation (DNR)  Continuous    Question Answer Comment  In the event of cardiac or respiratory ARREST Do not call a "code blue"   In the event of cardiac or respiratory ARREST Do not perform Intubation, CPR, defibrillation or ACLS   In the event  of cardiac or respiratory ARREST Use medication by any route, position, wound care, and other measures to relive pain and suffering. May use oxygen, suction and manual treatment of airway obstruction as needed for  comfort.      Jun 27, 2016 1342    Code Status History    Date Active Date Inactive Code Status Order ID Comments User Context   June 27, 2016  1:09 PM Jun 27, 2016  1:42 PM DNR [id number]  Erick Blinks, MD ED   02/19/2016  3:54 PM 02/25/2016  7:46 PM Full Code [id number]  Catarina Hartshorn, MD Inpatient   05/10/2013  4:07 PM 05/11/2013  4:27 PM Full Code [id number]  Wilson Singer, MD ED       Prognosis:   Hours - Days  Discharge Planning:  Anticipated Hospital Death  Care plan was discussed with nursing staff, case manager, and Dr. Mariea Clonts.   Thank you for allowing the Palliative Medicine Team to assist in the care of this patient.   Time In: 1310 Time Out: 1335 Total Time 25 minutes  Prolonged Time Billed  no       Greater than 50%  of this time was spent counseling and coordinating care related to the above assessment and plan.  Katheran Awe, NP  Please contact Palliative Medicine Team phone at [phone] for questions and concerns.

## 2016-06-14 NOTE — Discharge Summary (Signed)
[name], is a 56 y.o. male  DOB 10-25-1960  MRN [id number].  Admission date:  03-Apr-2016  Admitting Physician  Erick BlinksJehanzeb Memon, MD  Discharge Date:  06/08/2016  Primary MD  Pllc, [facility]     Admission Diagnosis  Recurrent left pleural effusion [J90] Acute onset sepsis (HCC) [A41.9] Sepsis (HCC) [A41.9]   Discharge Diagnosis  Recurrent left pleural effusion [J90] Acute onset sepsis (HCC) [A41.9] Sepsis (HCC) [A41.9]   Active Problems:   Diabetes (HCC)   Liver cirrhosis, alcoholic (HCC)   Thrombocytopenia (HCC)   Hyperbilirubinemia   Acute onset sepsis (HCC)   Coagulopathy (HCC)   Anasarca   Pleural effusion on left   Acute hepatic encephalopathy   Hypoalbuminemia   Hyponatremia   Palliative care encounter   DNR (do not resuscitate) discussion      Past Medical History:  Diagnosis Date  . Cellulitis   . Diabetes mellitus without complication (HCC)   . Elevated liver enzymes   . Liver failure (HCC)   . Obesity 05/10/2013    Past Surgical History:  Procedure Laterality Date  . HEMORROIDECTOMY         HPI  from the history and physical done on the day of admission:    Chief Complaint: altered mental status  HPI: [name] is a 56 y.o. male with medical history significant of alcoholic liver cirrhosis, diabetes, is brought to the hospital today with altered mental status. Patient was last [facility] in 02/2016 at which time he was treated anasarca and scrotal cellulitis. His wife reports that since that discharge, he had been admitted between 4 and 5 times to [facility]. He was recently [facility] a few weeks back with a Foley catheter in place since he had significant scrotal edema and was unable to pass urine. [facility] had [facility] on [facility] to have his Foley catheter removed as he reported  that his swelling was better. Wife reports that since his Foley catheter had been removed, he became increasingly lethargic and agitated. She is unaware of any fevers. She also admits that he has been refusing his lactulose for several days now. His by mouth intake has been poor. She also feels that his overall anasarca is worse. He's had diarrhea, 6-7 bowel movements every day despite refusing lactulose. He has not had any vomiting. He has not complained of shortness of breath.  ED Course: In the emergency room, patient was noted to be hypothermic with a rectal temperature of 93.9. He was also noted to be hypotensive with a systolic blood pressure in the 80s. Lab work indicated a W BC out of 18,000. He was also noted to have a rising creatinine above baseline to 1.3. Bilirubin was markedly elevated at 14. INR elevated at 3.6. Lactic acid is significantly elevated at 3.78. It was felt that patient may have sepsis and was started on IV fluids and antibiotics. He's been referred for admission.  Review  of Systems: As per HPI otherwise 10 point review of systems negative.      Hospital Course:   1)Comfort Care- Plan of care d/w Pt's wife Myriam Jacobson), Sons Gaylyn Rong, Lilly, and [place]) and [employer],  family stopped active treatment and patient was made comfort measures only.. D/w Ms Aurelio Jew from palliative care services. Due to severe persistent symptoms and overall poor prognosis palliative care consult was obtained. Patient was made DNR/DNI and comfort measures only . Patient expired at 2010 on 06/25/16.  2)C Diff Colitis with Sepsis and multi-organ failure- comfort measures as above in #1  3)Alcoholic cirrhosis now with coagulopathy and acute hepatic encephalopathy in the setting of sepsis  4)Renal Failure- Patient became anuric  5)Social/Ethics- please see full progress note dated 2016-06-25 for details    Discharge Condition: deceased    Discharge Medications     Allergies as of  2016/06/25      Reactions   Morphine And Related Nausea And Vomiting   Penicillins Nausea And Vomiting, Other (See Comments)   Has patient had a PCN reaction causing immediate rash, facial/tongue/throat swelling, SOB or lightheadedness with hypotension: No Has patient had a PCN reaction causing severe rash involving mucus membranes or skin necrosis: No Has patient had a PCN reaction that required hospitalization: No Has patient had a PCN reaction occurring within the last 10 years: No If all of the above answers are "NO", then may proceed with Cephalosporin use.      Medication List    ASK your doctor about these medications   aspirin EC 81 MG tablet Take 81 mg by mouth daily.   esomeprazole 40 MG capsule Commonly known as:  NEXIUM Take 40 mg by mouth daily before breakfast.   glyBURIDE 5 MG tablet Commonly known as:  DIABETA Take 2.5 mg by mouth daily with breakfast.   spironolactone 25 MG tablet Commonly known as:  ALDACTONE Take 1 tablet (25 mg total) by mouth daily.       Major procedures and Radiology Reports - PLEASE review detailed and final reports for all details, in brief -    Dg Chest 2 View  Result Date: 05/30/2016 CLINICAL DATA:  Dyspnea. EXAM: CHEST  2 VIEW COMPARISON:  05/25/2016 and 05/05/2016 FINDINGS: Moderate left pleural effusion and left lower lobe compressive atelectasis show no significant interval change. No evidence of right-sided pleural effusion or pneumothorax. Heart size remains normal. IMPRESSION: No significant change in moderate left pleural effusion and left lower lobe atelectasis. Electronically Signed   By: Myles Rosenthal M.D.   On: 05/30/2016 19:57   Dg Chest Portable 1 View  Result Date: 05/20/2016 CLINICAL DATA:  Weakness.  Sepsis. EXAM: PORTABLE CHEST 1 VIEW COMPARISON:  05/30/2016. FINDINGS: Cardiomegaly with pulmonary vascular prominence and bilateral interstitial prominence with prominent left pleural effusion. Congestive heart failure  could present this fashion. Bilateral pneumonitis cannot be excluded. It should be noted at the left-sided pleural effusion is increased significantly from prior exam. No pneumothorax. IMPRESSION: 1. Cardiomegaly with pulmonary vascular prominence and bilateral interstitial prominence with prominent left-sided pleural effusion. Congestive heart failure could present this fashion. Bilateral pneumonitis cannot be excluded. 2. Should be noted that the left-sided pleural effusion is progressed significantly from prior exam. Electronically Signed   By: Maisie Fus  Register   On: 06/04/2016 10:52    Micro Results    Recent Results (from the past 240 hour(s))  Blood Culture (routine x 2)     Status: None (Preliminary result)   Collection  Time: 05/24/2016 10:31 AM  Result Value Ref Range Status   Specimen Description BLOOD RIGHT WRIST  Final   Special Requests   Final    BOTTLES DRAWN AEROBIC AND ANAEROBIC Blood Culture adequate volume   Culture NO GROWTH 3 DAYS  Final   Report Status PENDING  Incomplete  Blood Culture (routine x 2)     Status: None (Preliminary result)   Collection Time: 05/24/2016 10:32 AM  Result Value Ref Range Status   Specimen Description BLOOD LEFT HAND  Final   Special Requests   Final    BOTTLES DRAWN AEROBIC AND ANAEROBIC Blood Culture results may not be optimal due to an inadequate volume of blood received in culture bottles   Culture NO GROWTH 3 DAYS  Final   Report Status PENDING  Incomplete  C difficile quick scan w PCR reflex     Status: Abnormal   Collection Time: 06/10/2016  1:43 PM  Result Value Ref Range Status   C Diff antigen (A) NEGATIVE Final    INVALID, POSSIBLE SAMPLE INTEGRITY COMPROMISED, TEST WAS REPEATED    Comment: MCGIBBONY,C RN @1653  BY MATTHEWS,B 06/02/2016   C Diff toxin (A) NEGATIVE Final    INVALID, POSSIBLE SAMPLE INTEGRITY COMPROMISED, TEST WAS REPEATED  Urine culture     Status: None   Collection Time: 06/01/2016  1:43 PM  Result Value Ref Range  Status   Specimen Description URINE, CLEAN CATCH  Final   Special Requests NONE  Final   Culture   Final    NO GROWTH Performed at [facility], [address], [place], [place] [address]    Report Status 06/08/2016 FINAL  Final  Clostridium Difficile by PCR     Status: Abnormal   Collection Time: 05/24/2016  1:43 PM  Result Value Ref Range Status   Toxigenic C Difficile by pcr POSITIVE (A) NEGATIVE Final    Comment: Positive for toxigenic C. difficile with little to no toxin production. Only treat if clinical presentation suggests symptomatic illness. RESULT CALLED TO, READ BACK BY AND VERIFIED WITH: I.MASLENJAK,RN AT 0157 06/04/16,L.PITT   MRSA PCR Screening     Status: None   Collection Time: 06/04/2016  1:44 PM  Result Value Ref Range Status   MRSA by PCR NEGATIVE NEGATIVE Final    Comment:        The GeneXpert MRSA Assay (FDA approved for NASAL specimens only), is one component of a comprehensive MRSA colonization surveillance program. It is not intended to diagnose MRSA infection nor to guide or monitor treatment for MRSA infections.        Today   Subjective    Becky [place] today has expired          Objective     Intake/Output Summary (Last 24 hours) at 06/06/16 1223 Last data filed at 08-Jun-2016 1940  Gross per 24 hour  Intake           974.17 ml  Output              750 ml  Net           224.17 ml    Exam  Expired UnResponsive, no spontaneous respiration or pulse   Data Review   CBC w Diff: Lab Results  Component Value Date   WBC 15.0 (H) 2016/06/08   HGB 9.5 (L) 2016-06-08   HCT 27.8 (L) 06/08/2016   PLT 81 (L) 06-08-2016   LYMPHOPCT 5 06/12/2016   MONOPCT 9 06/06/2016  EOSPCT 1 05/18/2016   BASOPCT 0 05/20/2016    CMP: Lab Results  Component Value Date   NA 132 (L) 24-Jun-2016   K 3.3 (L) 06/24/2016   CL 101 06-24-2016   CO2 18 (L) June 24, 2016   BUN 63 (H) 06/24/16   CREATININE 2.76 (H) June 24, 2016   PROT 5.8 (L) 2016/06/24     ALBUMIN 2.2 (L) 06-24-2016   BILITOT 15.2 (H) Jun 24, 2016   ALKPHOS 134 (H) 06-24-2016   AST 98 (H) 06-24-2016   ALT 48 06/24/2016  .   Total Discharge time is about 33 minutes  Bryli Mantey M.D on 06/06/2016 at 12:23 PM  [facility]   Office  [phone]  Voice Recognition Reubin [employer] dictation system was used to create this note, attempts have been made to correct errors. Please contact the author with questions and/or clarifications.

## 2016-06-14 DEATH — deceased

## 2016-06-19 ENCOUNTER — Ambulatory Visit: Payer: Self-pay | Admitting: Nurse Practitioner

## 2017-06-26 IMAGING — US US ABDOMEN LIMITED
1 series · 4 of 4 positions shown · non-contrast
Comparison: 02/19/2016.

CLINICAL DATA: 55-year-old male. Assess for ascites. Subsequent
encounter.

EXAM:
US ABDOMEN LIMITED - RIGHT UPPER QUADRANT

[Series 1: us abdomen limited · 0.25mm/px · 4 of 4 slices shown]
[im 1/4]
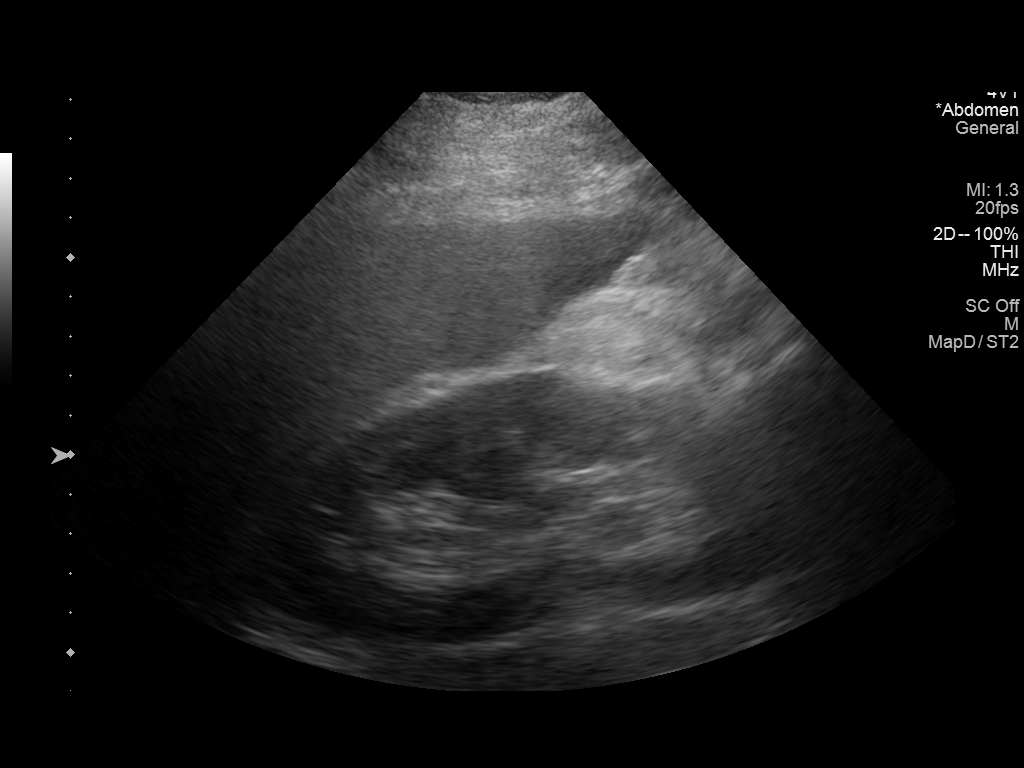
[im 2/4]
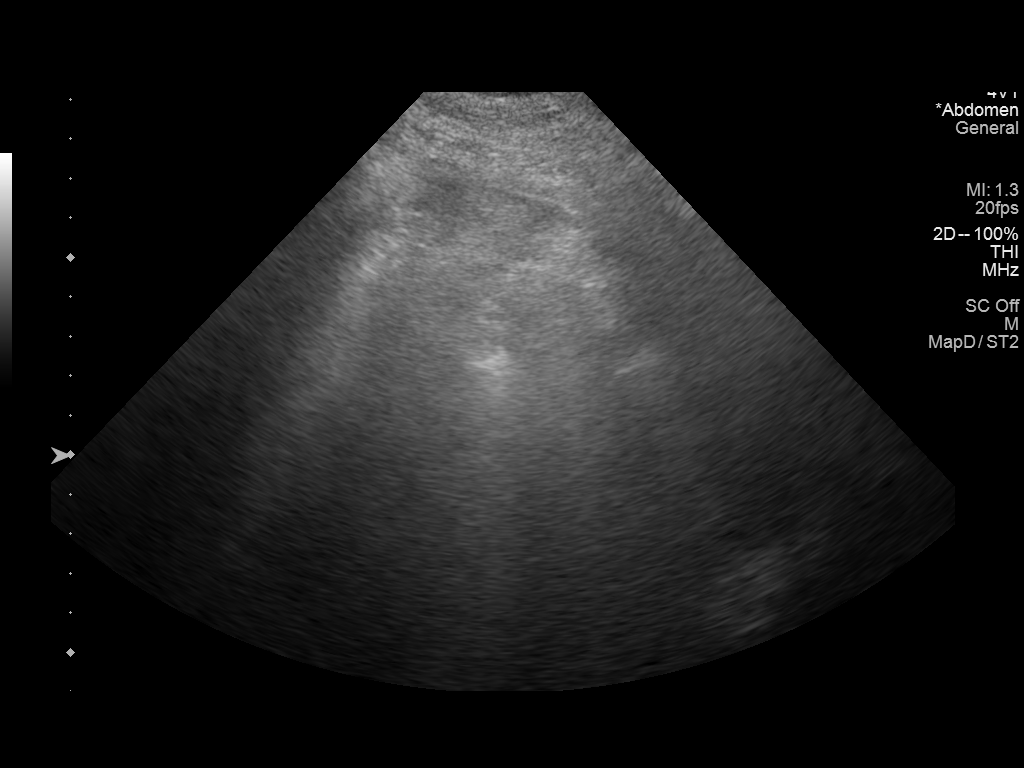
[im 3/4]
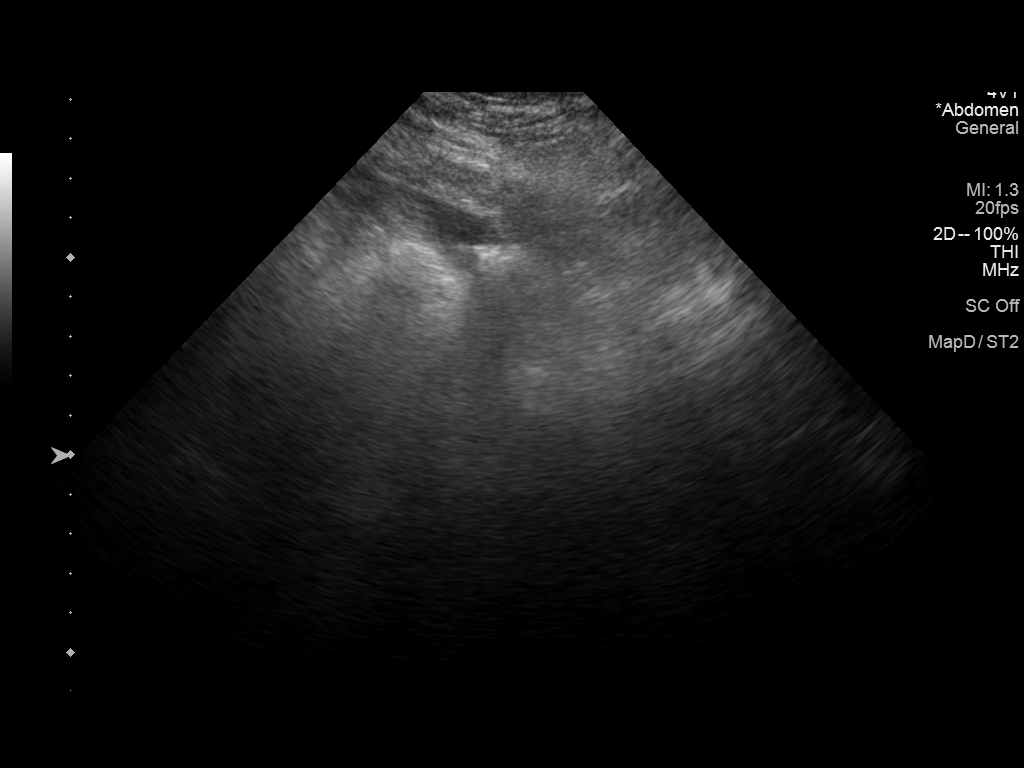
[im 4/4]
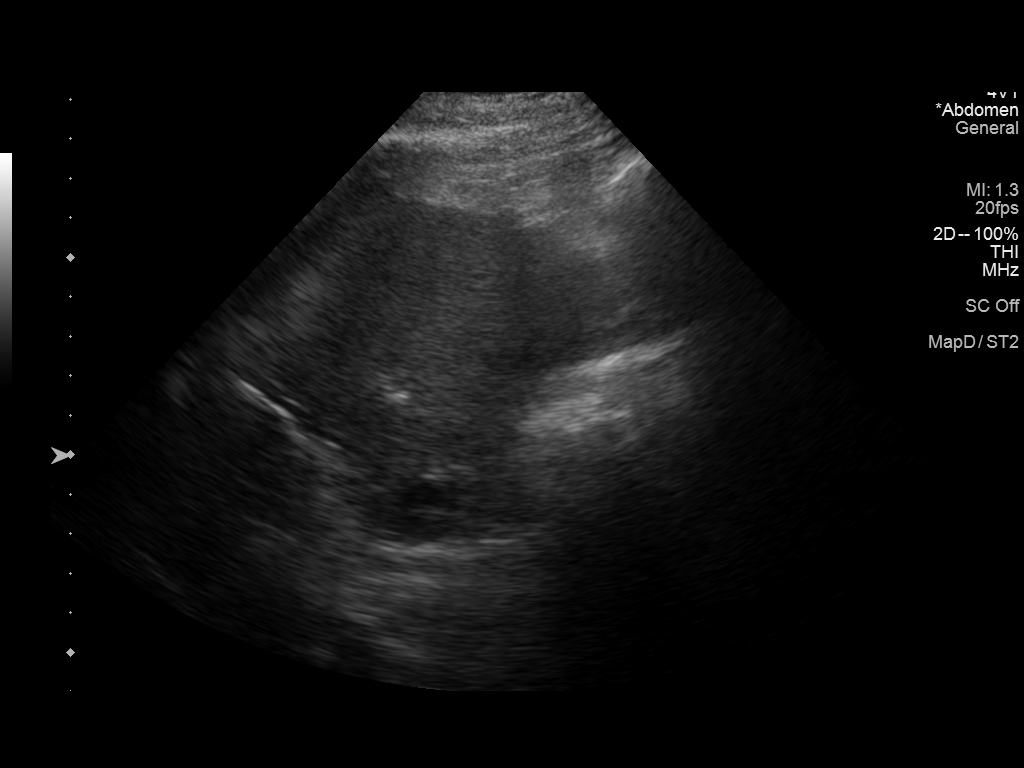

[4 of 4 positions shown; findings below may reference images not displayed]

FINDINGS: Four quadrant examination without findings of ascites. Patient
treated with Lasix in the interim.
IMPRESSION: No ascites detected.

## 2017-10-08 IMAGING — CR DG CHEST 1V PORT
1 series · 1 of 1 positions shown · non-contrast
Comparison: 05/30/2016.

CLINICAL DATA: Weakness.  Sepsis.

EXAM:
PORTABLE CHEST 1 VIEW

[portable]
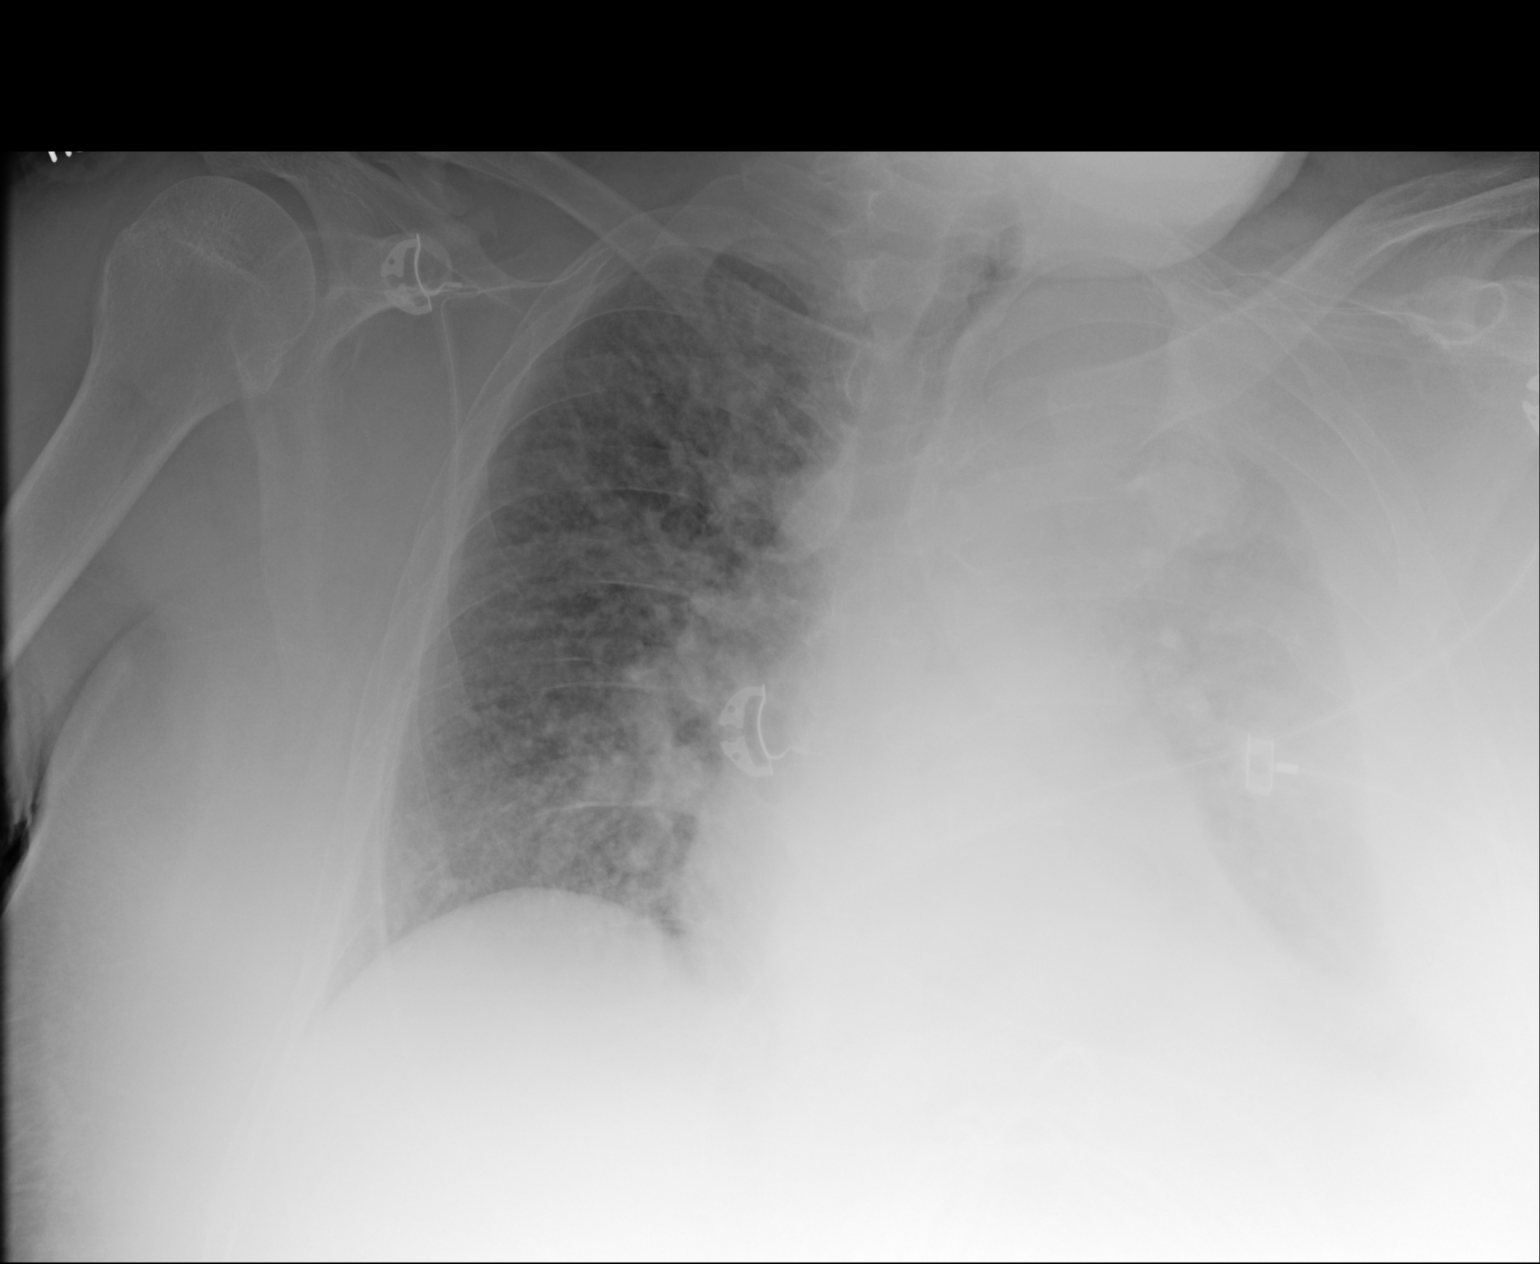

[1 of 1 positions shown; findings below may reference images not displayed]

FINDINGS: Cardiomegaly with pulmonary vascular prominence and bilateral
interstitial prominence with prominent left pleural effusion.
Congestive heart failure could present this fashion. Bilateral
pneumonitis cannot be excluded. It should be noted at the left-sided
pleural effusion is increased significantly from prior exam. No
pneumothorax.
IMPRESSION: 1. Cardiomegaly with pulmonary vascular prominence and bilateral
interstitial prominence with prominent left-sided pleural effusion.
Congestive heart failure could present this fashion. Bilateral
pneumonitis cannot be excluded.

2. Should be noted that the left-sided pleural effusion is
progressed significantly from prior exam.
